# Patient Record
Sex: Female | Born: 1940 | Race: White | Hispanic: No | Marital: Single | Smoking: Current every day smoker
Health system: Southern US, Community
[De-identification: ages and names within clinical notes are randomized; demographics above are authoritative.]

## PROBLEM LIST (undated history)

## (undated) DIAGNOSIS — C50919 Malignant neoplasm of unspecified site of unspecified female breast: Secondary | ICD-10-CM

## (undated) DIAGNOSIS — I10 Essential (primary) hypertension: Secondary | ICD-10-CM

## (undated) DIAGNOSIS — E7800 Pure hypercholesterolemia, unspecified: Secondary | ICD-10-CM

## (undated) DIAGNOSIS — R251 Tremor, unspecified: Secondary | ICD-10-CM

## (undated) DIAGNOSIS — C7800 Secondary malignant neoplasm of unspecified lung: Secondary | ICD-10-CM

## (undated) DIAGNOSIS — E049 Nontoxic goiter, unspecified: Secondary | ICD-10-CM

## (undated) DIAGNOSIS — K279 Peptic ulcer, site unspecified, unspecified as acute or chronic, without hemorrhage or perforation: Secondary | ICD-10-CM

## (undated) DIAGNOSIS — J449 Chronic obstructive pulmonary disease, unspecified: Secondary | ICD-10-CM

## (undated) DIAGNOSIS — K449 Diaphragmatic hernia without obstruction or gangrene: Secondary | ICD-10-CM

## (undated) DIAGNOSIS — F32A Depression, unspecified: Secondary | ICD-10-CM

## (undated) DIAGNOSIS — I2510 Atherosclerotic heart disease of native coronary artery without angina pectoris: Secondary | ICD-10-CM

## (undated) DIAGNOSIS — F329 Major depressive disorder, single episode, unspecified: Secondary | ICD-10-CM

## (undated) DIAGNOSIS — G243 Spasmodic torticollis: Secondary | ICD-10-CM

## (undated) DIAGNOSIS — K219 Gastro-esophageal reflux disease without esophagitis: Secondary | ICD-10-CM

## (undated) HISTORY — DX: Major depressive disorder, single episode, unspecified: F32.9

## (undated) HISTORY — DX: Atherosclerotic heart disease of native coronary artery without angina pectoris: I25.10

## (undated) HISTORY — DX: Chronic obstructive pulmonary disease, unspecified: J44.9

## (undated) HISTORY — DX: Malignant neoplasm of unspecified site of unspecified female breast: C50.919

## (undated) HISTORY — DX: Gastro-esophageal reflux disease without esophagitis: K21.9

## (undated) HISTORY — DX: Pure hypercholesterolemia, unspecified: E78.00

## (undated) HISTORY — DX: Depression, unspecified: F32.A

## (undated) HISTORY — PX: TONSILLECTOMY: SUR1361

## (undated) HISTORY — DX: Spasmodic torticollis: G24.3

## (undated) HISTORY — DX: Peptic ulcer, site unspecified, unspecified as acute or chronic, without hemorrhage or perforation: K27.9

## (undated) HISTORY — DX: Nontoxic goiter, unspecified: E04.9

## (undated) HISTORY — DX: Tremor, unspecified: R25.1

## (undated) HISTORY — DX: Diaphragmatic hernia without obstruction or gangrene: K44.9

## (undated) HISTORY — PX: APPENDECTOMY: SHX54

## (undated) HISTORY — DX: Essential (primary) hypertension: I10

## (undated) HISTORY — DX: Secondary malignant neoplasm of unspecified lung: C78.00

---

## 1990-08-26 DIAGNOSIS — C7800 Secondary malignant neoplasm of unspecified lung: Secondary | ICD-10-CM

## 1990-08-26 HISTORY — DX: Secondary malignant neoplasm of unspecified lung: C78.00

## 1994-08-26 HISTORY — PX: MASTECTOMY: SHX3

## 1995-08-27 HISTORY — PX: LUNG REMOVAL, PARTIAL: SHX233

## 2001-07-26 HISTORY — PX: ANGIOPLASTY: SHX39

## 2001-08-19 ENCOUNTER — Encounter: Payer: Self-pay | Admitting: Cardiology

## 2001-08-19 ENCOUNTER — Inpatient Hospital Stay (HOSPITAL_COMMUNITY): Admission: EM | Admit: 2001-08-19 | Discharge: 2001-08-21 | Payer: Self-pay | Admitting: Emergency Medicine

## 2001-08-26 HISTORY — PX: ROTATOR CUFF REPAIR: SHX139

## 2003-06-17 ENCOUNTER — Inpatient Hospital Stay (HOSPITAL_COMMUNITY): Admission: EM | Admit: 2003-06-17 | Discharge: 2003-06-20 | Payer: Self-pay | Admitting: Emergency Medicine

## 2003-06-17 ENCOUNTER — Encounter: Payer: Self-pay | Admitting: Emergency Medicine

## 2005-11-08 ENCOUNTER — Encounter: Admission: RE | Admit: 2005-11-08 | Discharge: 2005-11-08 | Payer: Self-pay | Admitting: Family Medicine

## 2005-11-15 ENCOUNTER — Ambulatory Visit: Payer: Self-pay | Admitting: Cardiology

## 2005-12-02 ENCOUNTER — Ambulatory Visit: Payer: Self-pay

## 2007-05-08 ENCOUNTER — Encounter: Admission: RE | Admit: 2007-05-08 | Discharge: 2007-05-08 | Payer: Self-pay | Admitting: Family Medicine

## 2007-10-22 ENCOUNTER — Ambulatory Visit: Payer: Self-pay | Admitting: Cardiology

## 2008-06-29 ENCOUNTER — Other Ambulatory Visit: Admission: RE | Admit: 2008-06-29 | Discharge: 2008-06-29 | Payer: Self-pay | Admitting: Family Medicine

## 2008-07-26 ENCOUNTER — Encounter: Admission: RE | Admit: 2008-07-26 | Discharge: 2008-07-26 | Payer: Self-pay | Admitting: Family Medicine

## 2008-08-15 ENCOUNTER — Encounter: Admission: RE | Admit: 2008-08-15 | Discharge: 2008-08-15 | Payer: Self-pay | Admitting: Family Medicine

## 2009-08-22 ENCOUNTER — Encounter: Admission: RE | Admit: 2009-08-22 | Discharge: 2009-08-22 | Payer: Self-pay | Admitting: Family Medicine

## 2010-08-23 ENCOUNTER — Encounter
Admission: RE | Admit: 2010-08-23 | Discharge: 2010-08-23 | Payer: Self-pay | Source: Home / Self Care | Attending: Family Medicine | Admitting: Family Medicine

## 2011-01-08 NOTE — Assessment & Plan Note (Signed)
[facility]                            CARDIOLOGY OFFICE NOTE   Courtney, Larson                         MRN:          [id number]  DATE:10/22/2007                            DOB:          [birth year]    PRIMARY:  Dr. [name].   CONSULTING PHYSICIAN:  Dr. [name].   THE REASON FOR CONSULTATION:  Evaluate patient preoperatively.  She has  coronary disease.   HISTORY OF PRESENT ILLNESS:  The patient is now 70 years old.  It has  been about 2 years since her last visit.  She was referred back by the  anesthesiologist.  She is due to have a left rotator cuff surgery.  Since I last saw her, she has had no new cardiovascular complaints.  She  will occasionally take nitroglycerin.  Says she took the last about 6  months ago.  She does this for chest pain.  Is somewhat reminiscent of  her previous angina but unchanged from what she described in 2007.  At  that time in 2007, she did have a stress perfusion study which  demonstrated no evidence of ischemia or infarct.  She had a well-  preserved ejection fraction 71%.   She is functional.  She climbs stairs and can do vacuuming (greater than  5 METS).  She cannot bring on any symptoms with this.  She does have  some chronic dyspnea with exertion but this is not any different.  She  has no resting shortness of breath.  Denies any PND or orthopnea.  Cut  back her cigarettes but still smokes 1/4 pack a day.  She plans to quit  smoking altogether in the next few days.   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  She had a subsequent  catheterization in 2004 for chest discomfort and had 30% in-stent  restenosis and irregularities in the circumflex but was otherwise free  of high-grade  disease).   PAST SURGICAL HISTORY:  Mastectomy as described, partial lobectomy,  appendectomy, tonsillectomy.   ALLERGIES:  ARTANE.   MEDICATIONS:  Aspirin 81 mg daily, Lipitor (the patient does not know  the dose), Protonix 40 mg daily.   SOCIAL HISTORY:  His the patient retired.  She likes cross-stitching.  She is divorced.  She has 2 daughters.  She was living in [place] and  lives here now.  She has been smoking for about 35 years.   FAMILY HISTORY:  Contributory for early coronary artery disease.  She  had a brother with a infarct in his 7s and transplant 52.   REVIEW OF SYSTEMS:  As stated in the HPI otherwise negative for other  systems.   PHYSICAL EXAMINATION:  The patient is in no acute distress.  Blood pressure 160/97, heart rate 95 and regular.  HEENT:  Eyelids unremarkable.  Pupils are equal, round, and reactive to  light and accommodation.  Fundi are not visualized.  Oral mucosa  unremarkable.  NECK:  No jugular venous distension 45 degrees, carotid upstroke brisk  and symmetric, no bruits, thyromegaly.  LYMPHATICS:  No cervical, axillary, or inguinal adenopathy.  LUNGS:  Clear to auscultation bilaterally.  BACK:  No costovertebral angle tenderness.  CHEST:  Unremarkable.  HEART:  PMI not displaced or sustained, S1 and S2 within normal limits,  no S3, no S4, no clicks, rubs, murmurs.  ABDOMEN:  Flat, positive bowel sounds, normal in frequency and pitch, no  bruits, rebound, guarding.  No midline pulsatile mass, hepatomegaly,  splenomegaly.  SKIN:  No rashes, no nodules.  EXTREMITIES:  With 2+ pulses throughout, no edema, cyanosis, clubbing.  NEURO:  Oriented to person, place, and time, cranial nerves 2-12 grossly  intact, motor grossly intact.    EKG sinus rhythm, rate 92, axis within normal limits, intervals within  normal limits, no acute ST-T wave changes.   ASSESSMENT/PLAN:  1. Preoperative clearance.  The patient has no high-risk features.      She  had no symptoms.  She has a reasonable functional level.  She      had a negative stress perfusion study 2 years ago.  This point no      further cardiovascular testing is suggested in she would be at      acceptable risk for planned procedure based on ACC/AHA guidelines.  2. Dyslipidemia per Dr. [name].  Goal will be an LDL less than 100,      HDL greater than 50.  3. Tobacco.  She plans on quitting cold [place].  We discussed this in      the past and she was given a prescription for Chantix.  She thinks      she is can stop all together, especially with the cost of      cigarettes going up.  4. Hypertension.  Blood pressure is slightly elevated.  However, she      reports takes at home is typically in 130s systolic and not above      90 diastolic. I asked her to keep blood pressure diary.  She has      actually done this for Dr. [name].  Certainly she would need      management perhaps with beta blocker if this does not exacerbate.      Will use ACE inhibitor given her      coronary disease.  I will defer to Dr. [name].  5. Follow-up will be as needed.     [name], MD, [facility]  Electronically Signed    JH/MedQ  DD: 10/22/2007  DT: 10/23/2007  Job #: [id number]   cc:   [name], M.D.  Erasmo Leventhal, M.D.

## 2011-01-11 NOTE — Discharge Summary (Signed)
[place]  Patient:    Courtney Larson, Courtney Larson Visit Number: [id number] MRN: [id number]          Service Type: MED Location: CCUB 2910 01 Attending Physician:  [name] Dictated by:   Joellyn Rued, P.A.-C. Admit Date:  08/19/2001                    Referring Physician Discharge [place]  NO DICTATION. Dictated by:   Joellyn Rued, P.A.-C. Attending Physician:  [name] DD:  08/21/01 TD:  08/21/01 Job: [id number]

## 2011-01-11 NOTE — Discharge Summary (Signed)
   NAMECHENNEL, OLIVOS                            ACCOUNT NO.:  [id number]   MEDICAL RECORD NO.:  [id number]                   PATIENT TYPE:  INP   LOCATION:  3727                                 FACILITY:  [facility]   PHYSICIAN:  [name], M.D.                DATE OF BIRTH:  07/02/1941   DATE OF ADMISSION:  06/17/2003  DATE OF DISCHARGE:                           DISCHARGE SUMMARY - REFERRING   ADDENDUM:  To dictation [phone].  She lives in [place], and I am having  trouble getting hold of her cardiologist.  It was Dr. Ellamae Sia.  I had given  his phone number, but I have tried it three times, it is always busy.  Here  is what I suggest we do with this transcription.  Ms. Steelman is going to be  staying with her daughter for the next couple of weeks, and so we night be  able to get the transcript sent to her daughter's house, and I am suggesting  we do that.  Her daughter is named Eugene Garnet, [address], [place],  [place].  I was thinking we could send a copy to her, and she could  just carry it over to her cardiologist, Dr. Ellamae Sia in [place].  Let's give  that a try.      Maple Mirza, P.A.                    [name], M.D.    GM/MEDQ  D:  06/20/2003  T:  06/20/2003  Job:  [id number]

## 2011-01-11 NOTE — H&P (Signed)
[place]  Patient:    Courtney Larson, Courtney Larson Visit Number: [id number] MRN: [id number]          Service Type: MED Location: CCUB 2910 01 Attending Physician:  [name] Dictated by:   [name], M.D. [facility] Admit Date:  08/19/2001   CC:         Dr. Krista Blue in [place]   History and Physical  PRIMARY PHYSICIAN:  Dr. Krista Blue in [place].  REASON FOR PRESENTATION:  Evaluate patient with chest pain.  HISTORY OF PRESENT ILLNESS:  The patient is a 70 year old without prior cardiac history.  She reports a history of chest discomfort on and off for one year.  This is not exertional, but it occurs at rest.  Today, she had a severe episode of this.  She called me at home.  Her daughter is my neighbor.  At the time we talked on the phone, the pain was substernal.  It had started in her jaw.  It was a pressure-type discomfort.  She did have radiation straight through to her back.  She was so uncomfortable, she was unable to complete sentences on the phone.  She was nauseated, but did not vomit.  She had no shortness of breath.  She does not described PND or orthopnea.  She had no palpitations.  She had no radiation down her arms.  She was similar to previous episodes.  The pain lasted approximately 35 minutes and was almost completely gone by the EMS squad arrived.  At that time, there were no acute EKG changes.  She is currently pain-free.  PAST MEDICAL HISTORY:  The patient has no history of hypertension or diabetes. She does have a history of peptic ulcer disease treated.  She has hypertriglyceridemia (she remembers it being 420).  Spastic torticollis, right breast cancer with mastectomy five years ago, metastasis to the lungs with partial resection four years ago, and treatment with chemotherapy.  PAST SURGICAL HISTORY:  (See above), right rotator cuff surgery in May, appendectomy, tonsillectomy.  ALLERGIES:   ARTANE.  MEDICATIONS:  Klonopin 5 mg three times per day, Flexeril 10 mg q.d.  SOCIAL HISTORY:  The patient is divorced.  She has three children.  She lives in [place].  She smoked about one-pack-per-day for 40 years.  FAMILY HISTORY:  Is contributory for a brother having massive myocardial infarction and subsequent cardiac transplantation at age 10.  REVIEW OF SYSTEMS:  As stated in the HPI, otherwise negative for all other systems.  PHYSICAL EXAMINATION:  GENERAL:  The patient is in no distress.  VITAL SIGNS:  Blood pressure 158/98, heart rate 76 and regular.  HEENT:  Eyelids unremarkable.  Pupils are equal, round and reactive to light. Fundi not visualized.  Oral mucosa unremarkable.  NECK:  No jugular venous distention.  Waveform within normal limits.  Carotid upstroke brisk and symmetric, no bruits.  No thyromegaly.  LYMPHATICS:  No cervical, axillary, or inguinal adenopathy.  LUNGS:  Clear to auscultation bilaterally.  BACK:  No costovertebral angle tenderness.  CHEST:  Unremarkable except for a right mastectomy.  HEART:  PMI non-displaced or sustained.  S1 and S2 within normal limits.  No S3 and no S4.  No murmurs.  ABDOMEN:  Flat, positive bowel sounds.  Normal in frequency and pitch.  No bruits, rebound, guarding, midline pulsatile mass.  No hepatomegaly or splenomegaly.  SKIN:  No rashes, and no nodules.  EXTREMITIES:  Two plus pulses throughout, no edema.  NEUROLOGIC:  Oriented to  person, place, and time.  Cranial nerves 2-12 grossly intact.  Motor grossly intact.  LABORATORY DATA:  EKG sinus rhythm, axis within normal limits, intervals within normal limits, no acute ST-T wave changes.  Labs pending.  Chest x-ray pending.  ASSESSMENT AND PLAN: 1. Chest discomfort.  The patients chest discomfort had some features typical    for unstable angina and an acute coronary syndrome.  She certainly has risk    factors, particularly her smoking.  However, it  is somewhat unusual.  These    same episodes have been occurring for over a year.  We have discussed this    at great length.  The patient needs to be referred for cardiac    catheterization with this being assumed to be unstable angina.  Further    evaluation based on these results. 2. Hypertriglyceridemia.  We will get a fasting lipid profile. 3. Tobacco.  We discussed stop smoking and she is committed to quitting. 4. Hypertension.  She is slightly hypertensive here, but otherwise has no    history of this.  We will follow this and treat as needed. Dictated by:   [name], M.D. [facility] Attending Physician:  [name] DD:  08/19/01 TD:  08/20/01 Job: [id number]

## 2011-01-11 NOTE — Discharge Summary (Signed)
Courtney Larson, Courtney Larson                            ACCOUNT NO.:  [id number]   MEDICAL RECORD NO.:  [id number]                   PATIENT TYPE:  INP   LOCATION:  3727                                 FACILITY:  [facility]   PHYSICIAN:  [name], M.D.                DATE OF BIRTH:  21-Feb-1941   DATE OF ADMISSION:  06/17/2003  DATE OF DISCHARGE:  06/20/2003                           DISCHARGE SUMMARY - REFERRING   DISCHARGE DIAGNOSES:  1. Chest discomfort and heaviness in crescendo pattern on admission.  2. History of coronary artery disease with negative Cardiolite August 2004.  3. A 30% in-stent restenosis at the proximal left anterior descending on     left heart catheterization June 20, 2003.  4. 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  5. Normal left ventricular function.   SECONDARY DIAGNOSES:  1. Peptic ulcer disease.  2. Hypertriglyceridemia.  3. Spastic torticollis.  4. History of right breast cancer status post mastectomy.  5. Metastasis to the lungs with partial resection six years ago and     chemotherapy.  6. Status post right rotator cuff surgery.  7. Partial resection of the left lower lobe.  8. Status post appendectomy.  9. Status post tonsillectomy.   PROCEDURE:  June 20, 2003, left heart catheterization by Dr. Charlies Constable.  The study showed that there is a 30% in-stent restenosis in the  proximal LAD, otherwise coronaries are without significant disease.  Left  ventricular function is normal.  There was a 50% left renal artery stenosis.  The patient tolerated the procedure well.  The catheter insertion was closed  with angioseal.  There has been no swelling, drainage, erythema or pain at  the catheterization site in the post procedure period.   DISPOSITION:  Courtney Larson is ready for discharge June 20, 2003, after  undergoing left heart catheterization by way of the right femoral artery.  No obstructive coronary artery disease was observed, although there was  a  30% in-stent restenosis of the proximal LAD.  The patient is chest pain free  at the time.  She will go home on Nexium 40 mg every 12 hours.  She is to  follow up with [facility] office Monday, July 04, 2003, at 2  o'clock in the afternoon for a catheterization site check in the right  groin.   DISCHARGE MEDICATIONS:  1. Enteric coated aspirin 81 mg daily.  2. Nexium 40 mg twice daily.  3. Zocor 40 mg daily at bedtime.  4. Klonopin 0.5 mg three times daily as needed.  5. Nitroglycerin 0.4 mg one tablet under the tongue every five minutes x3 as     needed for chest pain.  6. Toprol XL 25 mg daily.  This was started on admission.  Relaxes the heart     with blood pressure control.  7. For pain management she may use Tylenol 325 mg one to two  tablets every     four to six hours as needed for pain.   ACTIVITY:  She is not to engage in heavy lifting, driving or sexual  intercourse the next two days.   DISCHARGE DIET:  Low sodium, low cholesterol diet.   DISCHARGE INSTRUCTIONS:  She may shower.  She is to call [facility]  at [phone] if she experiences swelling or increased pain at the  catheterization site.   FOLLOW UP:  Once again, she has follow-up at [facility], [place],  [place], office Monday, July 04, 2003, at 2 o'clock in the  afternoon.   HISTORY OF PRESENT ILLNESS:  Courtney Larson is a 70 year old female with a  history of chest pain and cardiac catheterization with stenting of the LAD  in December of 2002.  She lives in [place], [place], but is visiting her  daughter in [place].  She says she has had chest  discomfort on and off for several months.  It is sporadic.  She did have a  stress test in August.  It was negative for ischemia.  She reports she had  about three episodes in the last three months, however, for the last five  days she has had more frequent chest discomfort.  She describes it as a   heaviness.  It seems to occur when she starts doing something.  It does ease  with nitroglycerin, usually she takes three for alleviation of the pain  either similar to her previous angina, although today she really has not had  any severe pain as she has had.  Today, June 17, 2003, she had more chest  pressure and describes a pain that has been 6/10 in intensity.  There is  no associated nausea, vomiting, or diaphoresis.  She has not had any  palpitations, presyncope, or syncope.  She denies any history of PND or  orthopnea.  She has chronic dyspnea, mild with exertion, and seems to be at  baseline.  Family thinks that she has been a bit more fatigued recently.  She rarely has had palpitations and has had no presyncope or syncope.  The  patient will be admitted.  She will be started on a beta-blocker, Lopressor  12.5 mg t.i.d., also on IV nitroglycerin as she will be maintained on a  heparin drip per pharmacy protocol.   HOSPITAL COURSE:  After admission to [facility] on  June 17, 2003, with a crescendo type chest pain similar to angina  experience in December 2002, when she had catheterization and stenting of  the LAD.  The patient has done well.  Blood pressure was controlled with  Toprol. She was treated with Protonix and also maintained on IV heparin.  She was chest pain free over the weekend of June 18, 2003, and June 19, 2003, and underwent left heart catheterization today, June 20, 2003,  with the findings as dictated above.  The patient is chest pain free at the  time of discharge.  Her catheterization site in the right groin shows no  evidence of erythema, swelling, drainage.  She has had very little pain at  that site.  She goes home with the medications and follow-up as listed  above.   LABORATORY DATA:  The following labs had been taken this hospitalization on June 20, 2003.  Serum electrolytes:  Sodium 139, potassium 3.7, chloride   107, carbonate 25, glucose 101, BUN 9 and creatinine 0.8.  A complete  blood  count on June 20, 2003, is white cells 6.3, hemoglobin 13.4, hematocrit  39.6, platelets 185.  Thyroid stimulating hormone 1.491.  Fasting lipid  profile shows her cholesterol was 95, triglycerides 324, HDL cholesterol was  21, LDL cholesterol is 9.  Cardiac markers this admission in the emergency  room on June 17, 2003, at 1631 hours, myoglobin is 72, CK-MB 2.5 and  troponin less than 0.05.  Other enzymes June 17, 2003, at  2250 hours with CK 78, CK-MB 2.2, troponin I is less than 0.01.  Cardiac  enzymes June 18, 2003, at 0410 hours with CK 85, CK-MB 2.7, troponin I  less than 0.01.  Cardiac enzymes on June 18, 2003, 1440 hours, with  CK  84, CK-MB 2.4, and troponin I is less than 0.01.      Maple Mirza, P.A.                    [name], M.D.    GM/MEDQ  D:  06/20/2003  T:  06/20/2003  Job:  [id number]   cc:   Alpha Gula, M.D.  [place], [place]   Dr. Rolm Bookbinder, [place]  phone:  [phone]

## 2011-01-11 NOTE — Cardiovascular Report (Signed)
Courtney Larson, NACHREINER                            ACCOUNT NO.:  [id number]   MEDICAL RECORD NO.:  [id number]                   PATIENT TYPE:  INP   LOCATION:  3727                                 FACILITY:  [facility]   PHYSICIAN:  Charlies Constable, M.D.                  DATE OF BIRTH:  22-May-1941   DATE OF PROCEDURE:  06/20/2003  DATE OF DISCHARGE:                              CARDIAC CATHETERIZATION   CLINICAL HISTORY:  Courtney Larson is 70 years old and is the mother of Courtney Larson, M.D. neighbor.  She was visiting here two years ago and had  unstable angina, had a stent placed in the proximal LAD by Carole Binning, M.D. [facility].  This was a BX Velocity stent.  She has been having  symptoms of chest tightness for about two months which have become worse in  the last four days.  She was again visiting her daughter here and Courtney Larson, M.D. was consulted and arranged for her admission with a diagnosis  of possible unstable angina.   PROCEDURE:  The procedure was performed via the right femoral artery using  arterial sheath and 6-French preformed coronary catheters.  A front wall  arterial puncture was performed and Omnipaque contrast was used.  Distal  aortogram was performed to rule out abdominal aortic aneurysm.  The right  femoral artery was closed with AngioSeal at the end of the procedure.  The  patient tolerated the procedure well and left the laboratory in satisfactory  condition.   RESULTS:  Aortic pressure 146/78 with a mean of 106.  Left ventricular  pressure was 146/17.   Left main coronary artery:  Free of significant disease.   Left anterior descending artery:  Gave rise to two diagonal branches and two  septal perforators.  There was 30% focal stenosis within the proximal aspect  of the stent in the proximal LAD.  There were irregularities throughout the  LAD, but no other significant obstruction.   Circumflex artery:  Gave rise to an atrial branch and two marginal  branches  and two posterolateral branches.  These vessels were free of significant  disease, although there were some irregularities.   Right coronary artery:  Small to moderate sized vessel.  Gave rise to a  conus branch, right ventricular branch, posterior descending artery, and a  very small posterolateral branch.  These vessels were free of significant  disease.   LEFT VENTRICULOGRAM:  The left ventriculogram performed in the RAO  projection showed good wall motion with no areas of hypokinesis.   DISTAL AORTOGRAM:  A distal aortogram was performed which showed 50%  narrowing in the left renal artery.  There were irregularities in the distal  aorta, but no major obstruction.   CONCLUSIONS:  Coronary artery disease status post stenting of the proximal  left anterior descending in 2002 with focal 30% narrowing in the  proximal  aspect of the stent and irregularities in the mid left anterior descending,  irregularities in the circumflex artery, no significant obstruction in the  right coronary artery, and normal left ventricular function.    RECOMMENDATIONS:  There does not appear to be any source of ischemia.  In  view of these findings I will discuss with Courtney Larson, M.D. regarding  further evaluation for other causes of her symptoms.                                               Charlies Constable, M.D.    BB/MEDQ  D:  06/20/2003  T:  06/20/2003  Job:  [id number]   cc:   Phillips Odor, DT  [place], [place]   CP Lab

## 2011-01-11 NOTE — Discharge Summary (Signed)
[place]. [facility]  Patient:    Courtney Larson, Courtney Larson Visit Number: [id number] MRN: [id number]          Service Type: MED Location: CCUB 2910 01 Attending Physician:  [name] Dictated by:   Joellyn Rued, P.A.-C. Admit Date:  08/19/2001 Disc. Date: 08/21/01   CC:         [name], M.D. [facility]   Discharge Summary  DATE OF BIRTH:  1940/12/20  ADMITTING PHYSICIAN:  Dr. Antoine Poche.  DISCHARGING PHYSICIAN:  Dr. Chales Abrahams.  SUMMARY OF HISTORY:  Courtney Larson is a 70 year old white female without prior cardiac history.  She contacted her daughters neighbor, who happens to be a physician in our practice.  She described a history of chest discomfort on and off for one year, occurring at rest.  On Christmas, she had a severe episode and called Dr. Antoine Poche at home.  The discomfort actually started in her jaw and went into her chest and described it as a pressure-type discomfort, radiating through to her back, associated with nausea.  She denied any shortness of breath or vomiting.  She felt it was similar to prior episodes, but this was more severe.  The discomfort lasted approximately 35 minutes and by the time EMS arrived, it had alleviated.  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  PAST SURGICAL HISTORY:  Notable for right rotator cuff tear, appendectomy, tonsillectomy.  MEDICATIONS:  Prior to admission, she was taking Klonopin 5 mg t.i.d. and Flexeril 10 q.d.  HABITS:  She smokes at least a pack per day and has been doing so for 40 years.  FAMILY HISTORY:  Notable for early coronary disease.  LABORATORY DATA:  Serial of three CK isoenzymes and troponins were negative for myocardial infarction.  Admission sodium was 141,  potassium 3.2, BUN 13, creatinine .9.  AST and ALT were slightly elevated at 49 and 50.  ALP was slightly elevated at 141.  Amylase and lipase were 83 and 38.  Fasting lipids showed a total cholesterol of 196, triglycerides 728, HDL 26, LDL was not calculated due to the high triglycerides.  H&H was 16.4 and 46.8.  Normal indices.  Platelets 187, WBC 6.1, PT 14.9, PTT 38, D-dimer .26.  Chest x-ray showed suboptimal inspiration, no active disease, probable lung resection at the left base, right axillary node dissection.  Her EKG showed normal sinus rhythm, nonspecific ST-T wave changes.  HOSPITAL COURSE:  Courtney Larson was admitted to [facility] to 2900. Enzymes and EKGs were negative for myocardial infarction.  She did not have any further chest discomfort.  She was placed on Lovenox, beta blocker, aspirin, as well as her home Klonopin.  On 08/20/01, Dr. Gerri Spore performed cardiac catheterization.  According to his progress notes, her EF was 61%, no MR, LV pressure 136/6, aorta 140/84.  She had normal coronaries except for a 20% proximal LAD followed by a 90% proximal LAD.  Angioplasty stenting was performed to the proximal LAD, reducing this from 90% to 0%, restoring TIMI grade 3 flow.  She was continued on Integrilin for 18 hours and recommended Plavix for at least 4 weeks.  Lopid was started in regards to her hypertriglyceridemia.  At the time of discharge, 08/21/01, H&H was 14.7 and 41.1, platelets 207, WBC 8.0.  Sodium 138, potassium 4.6,  BUN 7, creatinine .9, glucose 104.  After Dr. Chales Abrahams reviewed the chart, it was felt that she could be discharged home.  DISCHARGE DIAGNOSES: 1. Unstable angina, status post angioplasty stenting to the left anterior    descending as previously described. 2. Obesity. 3. Tobacco use. 4. Hypertriglyceridemia associated with a low HDL. 5. Hypertension. 6. History as previously.  DISPOSITION:  She is discharged home to her daughters home here  in [place].  She states that she will remain in [place] for a couple of weeks.  She will see Dr. Antoine Poche in the office on January 7 at 11:45 a.m. At some point, she will return to [place].  She was advised to follow up with her internist, Dr. Krista Blue, in [place], [place], and he will refer her for a cardiologist for further evaluation.  She was also recommended to consider a cardiac rehabilitation exercise program, diet especially low in salt and carbohydrates.  She will need fasting lipids and LFTs in approximately six weeks.  She was also advised to discontinue smoking or any tobacco products used.  Her activities were restricted to two days in regards to lifting, driving, sexual activity, or heavy exertion.  If she had any problems with her catheterization site, she was asked to call us.  DISCHARGE MEDICATIONS: 1. Plavix 75 mg q.d. duration to be determined by physician. 2. Lopressor 50 mg 1/2 tablet b.i.d. 3. Coated aspirin 325 mg q.d. 4. Sublingual nitroglycerin as needed. 5. Foltx 1 q.d. 6. Lopid 600 mg b.i.d.  The patient is taking a copy of her discharge summary with her. Dictated by:   Joellyn Rued, P.A.-C. Attending Physician:  [name] DD:  08/21/01 TD:  08/21/01 Job: [id number]

## 2011-01-11 NOTE — H&P (Signed)
NAMETASHAWNDA, BLEILER NO.:  [id number]   MEDICAL RECORD NO.:  [id number]                   PATIENT TYPE:  EMS   LOCATION:  MINO                                 FACILITY:  [facility]   PHYSICIAN:  [name], M.D.                DATE OF BIRTH:  13-Oct-1940   DATE OF ADMISSION:  06/17/2003  DATE OF DISCHARGE:                                HISTORY & PHYSICAL   PRIMARY CARE PHYSICIAN:  Dr. Krista Blue, [place].   REASON FOR ADMISSION:  Evaluate patient with chest pain.   HISTORY OF PRESENT ILLNESS:  The patient is a pleasant 70 year old with a  history of chest pain and cardiac catheterization with stenting of an LAD in  December, 2002.  She lives in [place] but is visiting her daughter in  town.  She says that she has had chest discomfort on and off for several  months.  It was sporadic.  She did have a stress test in August which  apparently was negative for any evidence of ischemia.  She reports she has  had about three episodes in the last three months.  However, for the last  five days, she has had more frequent chest discomfort.  She describes a  heaviness.  It seems to occur when she starts doing something.  It does ease  with nitroglycerin, usually taking three to be alleviated.  It is similar to  her previous angina, although today she really has not had any severe pain  such as she had.  Today she did have more than chest pressure and describes  a pain.  It has been 6/10 in intensity.  There has been no associated  nausea, vomiting or diaphoresis.  She has not had any palpitations,  presyncope or syncope.  She denies PND or orthopnea.  She has chronic  dyspnea, mildly with exertion.  It seems to be at baseline.  Her family  thinks that she has been a little bit more fatigued recently.  She rarely  has palpitations, but has no presyncope or syncope.   PAST MEDICAL HISTORY:  1. Peptic ulcer disease.  2. Hypertriglyceridemia.  3.  Spastic torticollis.  4. Right breast cancer with mastectomy.  5. Metastasis to the lungs with partial resection six years ago and     treatment with chemotherapy.   PAST SURGICAL HISTORY:  1. Right rotator cuff surgery.  2. Mastectomy.  3. Partial resection of the left lower lobe.  4. Appendectomy.  5. Tonsillectomy.   ALLERGIES:  Artane.   MEDICATIONS:  1. Klonopin 5 mg three times a day.  2. Prilosec 20 mg daily.  3. Zocor 40 mg daily.  4. Aspirin 81 mg daily.  5. Nitroglycerin patch.   SOCIAL HISTORY:  The patient is divorced.  She has three children. She lives  in [place].  She used to smoke one pack a day for 40  years.  She quit a  couple of years ago.   FAMILY HISTORY:  Contributory for a brother having a myocardial infarction  in his 17's and subsequent transplantation at age 27.   REVIEW OF SYSTEMS:  As stated in HPI.  Positive for longstanding chronic leg  pain, otherwise negative for other systems.   PHYSICAL EXAMINATION:  GENERAL:  The patient is in no distress.  VITAL SIGNS:  Blood pressure 133/72, heart rate 89 and regular.  HEENT:  Eyes unremarkable, pupils equal, round and reactive to light.  Fundi  not visualized.  Oral mucosa is unremarkable.  NECK:  No jugular venous distention. Waveform within normal limits.  Carotid  upstroke brisk and symmetrical.  No bruits or thyromegaly.  LYMPHATICS:  No cervical, axillary or inguinal adenopathy.  LUNGS:  Clear to auscultation bilaterally.  BACK:  No costal vertebral angle tenderness.  HEART:  PMI not displaced or sustained.  S1, S2 within normal limits.  No  S3, no S4, no murmurs.  CHEST:  Well-healed surgical scar.  ABDOMEN:  Positive bowel sounds, normal in frequency, pitch, bruise, no  rebound, no guarding, no midline pulsatile mass, no organomegaly.  SKIN:  No rash.  EXTREMITIES:  There are 2+ pulses throughout.  No edema, no cyanosis or  clubbing.  NEUROLOGIC:  Oriented to person, place and time.   Cranial nerves II-XII are  grossly intact.  Motor is grossly intact throughout.   LABORATORY DATA:  EKG:  Sinus rhythm, axis within normal limits.  Intervals within normal  limits.  Early transition lead V2.  No acute ST-T wave changes.   Labs are pending.   Chest x-ray:  Left lower surgical clips, poor inspiratory effort, no edema.   ASSESSMENT/PLAN:  1. The patient's chest discomfort is worrisome for unstable angina.  It has     been progressive.  She will be admitted with heparin and IV     nitroglycerin.  We will discontinue the topical nitroglycerin.  She will     continue aspirin.  She will have cardiac catheterization electively or     more urgently if needed.  2. Risk reductions.  She reports her lipids are excellent.  We will get a     lipid profile.  She will continue on he Zocor for now.  3. Breast cancer.  This has been cured.  4. Spastic torticollis.                                               [name], M.D.   JH/MEDQ  D:  06/17/2003  T:  06/18/2003  Job:  [id number]

## 2011-01-11 NOTE — Cardiovascular Report (Signed)
[place]. [facility]  Patient:    Courtney Larson, Courtney Larson Visit Number: [id number] MRN: [id number]          Service Type: MED Location: [facility]UB 2910 01 Attending Physician:  [name] Dictated by:   Daisey Must, M.D. [facility] Proc. Date: 08/20/01 Admit Date:  08/19/2001   CC:         [name], M.D. [facility]  Blanche East, M.D., [facility], [place]  Cardiac Catheterization Lab   Cardiac Catheterization  PROCEDURE: 1. Left heart catheterization with coronary angiography and left    ventriculography. 2. Percutaneous transluminal coronary angioplasty with stent placement in    the proximal left anterior descending artery.  CARDIOLOGIST:  Daisey Must, M.D. [facility]  INDICATION:  Ms. Doty is a 70 year old woman admitted yesterday with unstable angina.  CATHETERIZATION PROCEDURAL NOTE:  A 6-French sheath was placed in the right femoral artery.  Coronary angiography was performed with standard Judkins 6-French catheters.  Left ventriculography was performed with an angled pigtail catheter.  Contrast was Omnipaque.  There were no complications.  CATHETERIZATION RESULTS:  HEMODYNAMICS:  Left ventricular pressure 136/16, aortic pressure 140/84. There was no aortic valve gradient.  LEFT VENTRICULOGRAM:  Wall motion is normal.  Ejection fraction calculated at 61%.  There is trace mitral regurgitation.  CORONARY ANGIOGRAPHY: (Codominant).  Left main is normal.  Left anterior descending artery has a 20% stenosis just after its origin. Following this in the proximal vessel is a tubular 90% stenosis.  The LAD gives rise to a small first diagonal and a normal size second diagonal.  The LAD is relatively large and curls the apex.  The left circumflex is a large codominant vessel.  It gives rise to three small obtuse marginal branches, a normal size first posterolateral branch and a small second posterolateral branch.  The left circumflex is  angiographically normal.  The right coronary artery is a relatively small codominant vessel.  It ends as a normal size posterior descending artery.  The right coronary artery is angiographically normal.  IMPRESSIONS: 1. Normal left ventricular systolic function. 2. One-vessel coronary artery disease.  The culprit lesion is the 90%    stenosis in the proximal left anterior descending artery.  PLAN:  Percutaneous intervention of the left anterior descending artery.  See below.  PTCA PROCEDURE NOTE:  Following completion of the diagnostic catheterization, we proceeded with percutaneous coronary intervention.  The 6-French sheath in the right femoral artery was exchanged over wire for a 7-French sheath. Heparin and Integrilin were administered per protocol.  We used a 7-French JL4 guiding catheter and a BMW wire.  The lesion was initially predilated with a 3.0 x 15 mm Quantum balloon inflated to 8 atmospheres.  We then deployed a 3.0 x 18 m  Bx Velocity stent at a deployment pressure of 18 atmospheres.  The stent was post dilated with a 3.5 x 13 mm PowerSail balloon.  This balloon was inflated to 10 and then 16 atmospheres at the distal aspect of the stent and 18 followed by 22 atmospheres in the proximal aspect of the stent.  Final angiographic images revealed patency of the LAD with 0% residual stenosis and TIMI-3 flow.  COMPLICATIONS:  None.  RESULTS:  Successful PTCA with stent placement in the proximal left anterior descending artery reducing a 90% stenosis to 0% residual with TIMI-3 flow.  PLAN:  Integrilin will be continued for 18 hours.  Plavix will be administered for a minimum of four weeks. Dictated by:   Daisey Must, M.D.  [facility] Attending Physician:  [name] DD:  08/20/01 TD:  08/20/01 Job: [id number]

## 2011-09-19 DIAGNOSIS — J329 Chronic sinusitis, unspecified: Secondary | ICD-10-CM | POA: Diagnosis not present

## 2011-09-19 DIAGNOSIS — R059 Cough, unspecified: Secondary | ICD-10-CM | POA: Diagnosis not present

## 2011-09-19 DIAGNOSIS — J029 Acute pharyngitis, unspecified: Secondary | ICD-10-CM | POA: Diagnosis not present

## 2011-09-19 DIAGNOSIS — R05 Cough: Secondary | ICD-10-CM | POA: Diagnosis not present

## 2011-09-19 DIAGNOSIS — J209 Acute bronchitis, unspecified: Secondary | ICD-10-CM | POA: Diagnosis not present

## 2011-09-25 ENCOUNTER — Other Ambulatory Visit: Payer: Self-pay | Admitting: Family Medicine

## 2011-09-25 DIAGNOSIS — Z1231 Encounter for screening mammogram for malignant neoplasm of breast: Secondary | ICD-10-CM

## 2011-10-14 ENCOUNTER — Other Ambulatory Visit: Payer: Self-pay | Admitting: Family Medicine

## 2011-10-14 ENCOUNTER — Ambulatory Visit
Admission: RE | Admit: 2011-10-14 | Discharge: 2011-10-14 | Disposition: A | Discharge status: Home / Self Care | Payer: Medicare Other | Source: Ambulatory Visit | Attending: Family Medicine | Admitting: Family Medicine

## 2011-10-14 DIAGNOSIS — Z720 Tobacco use: Secondary | ICD-10-CM

## 2011-10-14 DIAGNOSIS — R05 Cough: Secondary | ICD-10-CM | POA: Diagnosis not present

## 2011-10-14 DIAGNOSIS — J40 Bronchitis, not specified as acute or chronic: Secondary | ICD-10-CM

## 2011-10-14 DIAGNOSIS — Z853 Personal history of malignant neoplasm of breast: Secondary | ICD-10-CM | POA: Diagnosis not present

## 2011-10-18 ENCOUNTER — Ambulatory Visit
Admission: RE | Admit: 2011-10-18 | Discharge: 2011-10-18 | Disposition: A | Discharge status: Home / Self Care | Payer: Medicare Other | Source: Ambulatory Visit | Attending: Family Medicine | Admitting: Family Medicine

## 2011-10-18 DIAGNOSIS — Z1231 Encounter for screening mammogram for malignant neoplasm of breast: Secondary | ICD-10-CM | POA: Diagnosis not present

## 2011-11-11 DIAGNOSIS — G243 Spasmodic torticollis: Secondary | ICD-10-CM | POA: Diagnosis not present

## 2011-11-21 DIAGNOSIS — K219 Gastro-esophageal reflux disease without esophagitis: Secondary | ICD-10-CM | POA: Diagnosis not present

## 2011-11-21 DIAGNOSIS — J309 Allergic rhinitis, unspecified: Secondary | ICD-10-CM | POA: Diagnosis not present

## 2011-11-21 DIAGNOSIS — R7301 Impaired fasting glucose: Secondary | ICD-10-CM | POA: Diagnosis not present

## 2011-11-21 DIAGNOSIS — Z79899 Other long term (current) drug therapy: Secondary | ICD-10-CM | POA: Diagnosis not present

## 2011-11-21 DIAGNOSIS — I2510 Atherosclerotic heart disease of native coronary artery without angina pectoris: Secondary | ICD-10-CM | POA: Diagnosis not present

## 2011-11-21 DIAGNOSIS — I10 Essential (primary) hypertension: Secondary | ICD-10-CM | POA: Diagnosis not present

## 2011-11-21 DIAGNOSIS — E782 Mixed hyperlipidemia: Secondary | ICD-10-CM | POA: Diagnosis not present

## 2011-11-21 DIAGNOSIS — J329 Chronic sinusitis, unspecified: Secondary | ICD-10-CM | POA: Diagnosis not present

## 2011-11-21 DIAGNOSIS — Z7901 Long term (current) use of anticoagulants: Secondary | ICD-10-CM | POA: Diagnosis not present

## 2011-11-21 DIAGNOSIS — E559 Vitamin D deficiency, unspecified: Secondary | ICD-10-CM | POA: Diagnosis not present

## 2012-04-13 DIAGNOSIS — G243 Spasmodic torticollis: Secondary | ICD-10-CM | POA: Diagnosis not present

## 2012-04-30 DIAGNOSIS — H9319 Tinnitus, unspecified ear: Secondary | ICD-10-CM | POA: Diagnosis not present

## 2012-04-30 DIAGNOSIS — H6120 Impacted cerumen, unspecified ear: Secondary | ICD-10-CM | POA: Diagnosis not present

## 2012-04-30 DIAGNOSIS — J309 Allergic rhinitis, unspecified: Secondary | ICD-10-CM | POA: Diagnosis not present

## 2012-08-11 DIAGNOSIS — R197 Diarrhea, unspecified: Secondary | ICD-10-CM | POA: Diagnosis not present

## 2012-08-11 DIAGNOSIS — I10 Essential (primary) hypertension: Secondary | ICD-10-CM | POA: Diagnosis not present

## 2012-08-11 DIAGNOSIS — Z79899 Other long term (current) drug therapy: Secondary | ICD-10-CM | POA: Diagnosis not present

## 2012-08-11 DIAGNOSIS — I2510 Atherosclerotic heart disease of native coronary artery without angina pectoris: Secondary | ICD-10-CM | POA: Diagnosis not present

## 2012-08-11 DIAGNOSIS — E782 Mixed hyperlipidemia: Secondary | ICD-10-CM | POA: Diagnosis not present

## 2012-08-11 DIAGNOSIS — K589 Irritable bowel syndrome without diarrhea: Secondary | ICD-10-CM | POA: Diagnosis not present

## 2012-08-11 DIAGNOSIS — R634 Abnormal weight loss: Secondary | ICD-10-CM | POA: Diagnosis not present

## 2012-08-11 DIAGNOSIS — E559 Vitamin D deficiency, unspecified: Secondary | ICD-10-CM | POA: Diagnosis not present

## 2012-08-11 DIAGNOSIS — R7301 Impaired fasting glucose: Secondary | ICD-10-CM | POA: Diagnosis not present

## 2012-08-11 DIAGNOSIS — Z7901 Long term (current) use of anticoagulants: Secondary | ICD-10-CM | POA: Diagnosis not present

## 2012-09-15 DIAGNOSIS — Z86010 Personal history of colonic polyps: Secondary | ICD-10-CM | POA: Diagnosis not present

## 2012-09-15 DIAGNOSIS — K591 Functional diarrhea: Secondary | ICD-10-CM | POA: Diagnosis not present

## 2012-09-15 DIAGNOSIS — R634 Abnormal weight loss: Secondary | ICD-10-CM | POA: Diagnosis not present

## 2012-09-22 DIAGNOSIS — IMO0002 Reserved for concepts with insufficient information to code with codable children: Secondary | ICD-10-CM | POA: Diagnosis not present

## 2012-09-22 DIAGNOSIS — R197 Diarrhea, unspecified: Secondary | ICD-10-CM | POA: Diagnosis not present

## 2012-09-22 DIAGNOSIS — R7989 Other specified abnormal findings of blood chemistry: Secondary | ICD-10-CM | POA: Diagnosis not present

## 2012-09-22 DIAGNOSIS — Z23 Encounter for immunization: Secondary | ICD-10-CM | POA: Diagnosis not present

## 2012-09-22 DIAGNOSIS — Z1239 Encounter for other screening for malignant neoplasm of breast: Secondary | ICD-10-CM | POA: Diagnosis not present

## 2012-09-22 DIAGNOSIS — R319 Hematuria, unspecified: Secondary | ICD-10-CM | POA: Diagnosis not present

## 2012-09-22 DIAGNOSIS — F17200 Nicotine dependence, unspecified, uncomplicated: Secondary | ICD-10-CM | POA: Diagnosis not present

## 2012-09-22 DIAGNOSIS — Z13820 Encounter for screening for osteoporosis: Secondary | ICD-10-CM | POA: Diagnosis not present

## 2012-09-23 ENCOUNTER — Other Ambulatory Visit: Payer: Self-pay | Admitting: Family Medicine

## 2012-09-23 DIAGNOSIS — Z1231 Encounter for screening mammogram for malignant neoplasm of breast: Secondary | ICD-10-CM

## 2012-09-23 DIAGNOSIS — Z780 Asymptomatic menopausal state: Secondary | ICD-10-CM

## 2012-10-05 DIAGNOSIS — G243 Spasmodic torticollis: Secondary | ICD-10-CM | POA: Diagnosis not present

## 2012-10-14 DIAGNOSIS — Z86010 Personal history of colonic polyps: Secondary | ICD-10-CM | POA: Diagnosis not present

## 2012-10-14 DIAGNOSIS — D126 Benign neoplasm of colon, unspecified: Secondary | ICD-10-CM | POA: Diagnosis not present

## 2012-10-14 DIAGNOSIS — Z09 Encounter for follow-up examination after completed treatment for conditions other than malignant neoplasm: Secondary | ICD-10-CM | POA: Diagnosis not present

## 2012-10-14 DIAGNOSIS — K5730 Diverticulosis of large intestine without perforation or abscess without bleeding: Secondary | ICD-10-CM | POA: Diagnosis not present

## 2012-11-20 ENCOUNTER — Ambulatory Visit
Admission: RE | Admit: 2012-11-20 | Discharge: 2012-11-20 | Disposition: A | Discharge status: Home / Self Care | Payer: Medicare Other | Source: Ambulatory Visit | Attending: Family Medicine | Admitting: Family Medicine

## 2012-11-20 DIAGNOSIS — Z1231 Encounter for screening mammogram for malignant neoplasm of breast: Secondary | ICD-10-CM

## 2012-12-29 DIAGNOSIS — I2510 Atherosclerotic heart disease of native coronary artery without angina pectoris: Secondary | ICD-10-CM | POA: Diagnosis not present

## 2012-12-29 DIAGNOSIS — R7301 Impaired fasting glucose: Secondary | ICD-10-CM | POA: Diagnosis not present

## 2012-12-29 DIAGNOSIS — Z7901 Long term (current) use of anticoagulants: Secondary | ICD-10-CM | POA: Diagnosis not present

## 2012-12-29 DIAGNOSIS — F329 Major depressive disorder, single episode, unspecified: Secondary | ICD-10-CM | POA: Diagnosis not present

## 2012-12-29 DIAGNOSIS — E890 Postprocedural hypothyroidism: Secondary | ICD-10-CM | POA: Diagnosis not present

## 2012-12-29 DIAGNOSIS — E559 Vitamin D deficiency, unspecified: Secondary | ICD-10-CM | POA: Diagnosis not present

## 2012-12-29 DIAGNOSIS — Z79899 Other long term (current) drug therapy: Secondary | ICD-10-CM | POA: Diagnosis not present

## 2012-12-29 DIAGNOSIS — F3289 Other specified depressive episodes: Secondary | ICD-10-CM | POA: Diagnosis not present

## 2012-12-29 DIAGNOSIS — K219 Gastro-esophageal reflux disease without esophagitis: Secondary | ICD-10-CM | POA: Diagnosis not present

## 2012-12-29 DIAGNOSIS — I10 Essential (primary) hypertension: Secondary | ICD-10-CM | POA: Diagnosis not present

## 2012-12-29 DIAGNOSIS — E785 Hyperlipidemia, unspecified: Secondary | ICD-10-CM | POA: Diagnosis not present

## 2013-01-04 DIAGNOSIS — G243 Spasmodic torticollis: Secondary | ICD-10-CM | POA: Diagnosis not present

## 2013-04-11 DIAGNOSIS — T07XXXA Unspecified multiple injuries, initial encounter: Secondary | ICD-10-CM | POA: Diagnosis not present

## 2013-04-11 DIAGNOSIS — Z853 Personal history of malignant neoplasm of breast: Secondary | ICD-10-CM | POA: Diagnosis not present

## 2013-04-11 DIAGNOSIS — S6990XA Unspecified injury of unspecified wrist, hand and finger(s), initial encounter: Secondary | ICD-10-CM | POA: Diagnosis not present

## 2013-04-11 DIAGNOSIS — Z951 Presence of aortocoronary bypass graft: Secondary | ICD-10-CM | POA: Diagnosis not present

## 2013-04-11 DIAGNOSIS — X58XXXA Exposure to other specified factors, initial encounter: Secondary | ICD-10-CM | POA: Diagnosis not present

## 2013-04-11 DIAGNOSIS — M169 Osteoarthritis of hip, unspecified: Secondary | ICD-10-CM | POA: Diagnosis not present

## 2013-04-11 DIAGNOSIS — E785 Hyperlipidemia, unspecified: Secondary | ICD-10-CM | POA: Diagnosis not present

## 2013-04-11 DIAGNOSIS — S5000XA Contusion of unspecified elbow, initial encounter: Secondary | ICD-10-CM | POA: Diagnosis not present

## 2013-04-11 DIAGNOSIS — S7000XA Contusion of unspecified hip, initial encounter: Secondary | ICD-10-CM | POA: Diagnosis not present

## 2013-04-11 DIAGNOSIS — K5730 Diverticulosis of large intestine without perforation or abscess without bleeding: Secondary | ICD-10-CM | POA: Diagnosis not present

## 2013-04-11 DIAGNOSIS — Z043 Encounter for examination and observation following other accident: Secondary | ICD-10-CM | POA: Diagnosis not present

## 2013-04-11 DIAGNOSIS — S59919A Unspecified injury of unspecified forearm, initial encounter: Secondary | ICD-10-CM | POA: Diagnosis not present

## 2013-04-11 DIAGNOSIS — K219 Gastro-esophageal reflux disease without esophagitis: Secondary | ICD-10-CM | POA: Diagnosis not present

## 2013-04-11 DIAGNOSIS — I10 Essential (primary) hypertension: Secondary | ICD-10-CM | POA: Diagnosis not present

## 2013-04-11 DIAGNOSIS — S72109A Unspecified trochanteric fracture of unspecified femur, initial encounter for closed fracture: Secondary | ICD-10-CM | POA: Diagnosis not present

## 2013-04-11 DIAGNOSIS — Y33XXXA Other specified events, undetermined intent, initial encounter: Secondary | ICD-10-CM | POA: Diagnosis not present

## 2013-04-11 DIAGNOSIS — I7090 Unspecified atherosclerosis: Secondary | ICD-10-CM | POA: Diagnosis not present

## 2013-04-11 DIAGNOSIS — Z8679 Personal history of other diseases of the circulatory system: Secondary | ICD-10-CM | POA: Diagnosis not present

## 2013-04-11 DIAGNOSIS — IMO0002 Reserved for concepts with insufficient information to code with codable children: Secondary | ICD-10-CM | POA: Diagnosis not present

## 2013-06-14 DIAGNOSIS — G243 Spasmodic torticollis: Secondary | ICD-10-CM | POA: Diagnosis not present

## 2013-09-01 DIAGNOSIS — E559 Vitamin D deficiency, unspecified: Secondary | ICD-10-CM | POA: Diagnosis not present

## 2013-09-01 DIAGNOSIS — R748 Abnormal levels of other serum enzymes: Secondary | ICD-10-CM | POA: Diagnosis not present

## 2013-09-01 DIAGNOSIS — R7301 Impaired fasting glucose: Secondary | ICD-10-CM | POA: Diagnosis not present

## 2013-09-01 DIAGNOSIS — K219 Gastro-esophageal reflux disease without esophagitis: Secondary | ICD-10-CM | POA: Diagnosis not present

## 2013-09-01 DIAGNOSIS — E785 Hyperlipidemia, unspecified: Secondary | ICD-10-CM | POA: Diagnosis not present

## 2013-09-01 DIAGNOSIS — E890 Postprocedural hypothyroidism: Secondary | ICD-10-CM | POA: Diagnosis not present

## 2013-09-01 DIAGNOSIS — I2510 Atherosclerotic heart disease of native coronary artery without angina pectoris: Secondary | ICD-10-CM | POA: Diagnosis not present

## 2013-09-01 DIAGNOSIS — I10 Essential (primary) hypertension: Secondary | ICD-10-CM | POA: Diagnosis not present

## 2013-09-20 DIAGNOSIS — G243 Spasmodic torticollis: Secondary | ICD-10-CM | POA: Diagnosis not present

## 2013-12-15 ENCOUNTER — Other Ambulatory Visit: Payer: Self-pay

## 2013-12-15 DIAGNOSIS — Z1231 Encounter for screening mammogram for malignant neoplasm of breast: Secondary | ICD-10-CM

## 2013-12-15 DIAGNOSIS — Z853 Personal history of malignant neoplasm of breast: Secondary | ICD-10-CM

## 2013-12-15 DIAGNOSIS — Z9011 Acquired absence of right breast and nipple: Secondary | ICD-10-CM

## 2014-01-28 DIAGNOSIS — I2510 Atherosclerotic heart disease of native coronary artery without angina pectoris: Secondary | ICD-10-CM | POA: Diagnosis not present

## 2014-01-28 DIAGNOSIS — R7301 Impaired fasting glucose: Secondary | ICD-10-CM | POA: Diagnosis not present

## 2014-01-28 DIAGNOSIS — E559 Vitamin D deficiency, unspecified: Secondary | ICD-10-CM | POA: Diagnosis not present

## 2014-01-28 DIAGNOSIS — E890 Postprocedural hypothyroidism: Secondary | ICD-10-CM | POA: Diagnosis not present

## 2014-01-28 DIAGNOSIS — I10 Essential (primary) hypertension: Secondary | ICD-10-CM | POA: Diagnosis not present

## 2014-01-28 DIAGNOSIS — E785 Hyperlipidemia, unspecified: Secondary | ICD-10-CM | POA: Diagnosis not present

## 2014-01-28 DIAGNOSIS — R748 Abnormal levels of other serum enzymes: Secondary | ICD-10-CM | POA: Diagnosis not present

## 2014-01-28 DIAGNOSIS — Z79899 Other long term (current) drug therapy: Secondary | ICD-10-CM | POA: Diagnosis not present

## 2014-02-02 ENCOUNTER — Ambulatory Visit
Admission: RE | Admit: 2014-02-02 | Discharge: 2014-02-02 | Disposition: A | Discharge status: Home / Self Care | Payer: Medicare Other | Source: Ambulatory Visit

## 2014-02-02 ENCOUNTER — Other Ambulatory Visit: Payer: Self-pay

## 2014-02-02 DIAGNOSIS — Z9011 Acquired absence of right breast and nipple: Secondary | ICD-10-CM

## 2014-02-02 DIAGNOSIS — Z853 Personal history of malignant neoplasm of breast: Secondary | ICD-10-CM

## 2014-02-02 DIAGNOSIS — Z1231 Encounter for screening mammogram for malignant neoplasm of breast: Secondary | ICD-10-CM

## 2014-02-07 DIAGNOSIS — G243 Spasmodic torticollis: Secondary | ICD-10-CM | POA: Diagnosis not present

## 2014-07-04 DIAGNOSIS — M436 Torticollis: Secondary | ICD-10-CM | POA: Diagnosis not present

## 2014-07-04 DIAGNOSIS — G243 Spasmodic torticollis: Secondary | ICD-10-CM | POA: Diagnosis not present

## 2014-07-28 DIAGNOSIS — Z79899 Other long term (current) drug therapy: Secondary | ICD-10-CM | POA: Diagnosis not present

## 2014-07-28 DIAGNOSIS — I10 Essential (primary) hypertension: Secondary | ICD-10-CM | POA: Diagnosis not present

## 2014-07-28 DIAGNOSIS — E559 Vitamin D deficiency, unspecified: Secondary | ICD-10-CM | POA: Diagnosis not present

## 2014-07-28 DIAGNOSIS — R251 Tremor, unspecified: Secondary | ICD-10-CM | POA: Diagnosis not present

## 2014-07-28 DIAGNOSIS — I2510 Atherosclerotic heart disease of native coronary artery without angina pectoris: Secondary | ICD-10-CM | POA: Diagnosis not present

## 2014-07-28 DIAGNOSIS — E782 Mixed hyperlipidemia: Secondary | ICD-10-CM | POA: Diagnosis not present

## 2014-07-28 DIAGNOSIS — E890 Postprocedural hypothyroidism: Secondary | ICD-10-CM | POA: Diagnosis not present

## 2014-07-28 DIAGNOSIS — K219 Gastro-esophageal reflux disease without esophagitis: Secondary | ICD-10-CM | POA: Diagnosis not present

## 2014-10-05 DIAGNOSIS — H5213 Myopia, bilateral: Secondary | ICD-10-CM | POA: Diagnosis not present

## 2014-10-05 DIAGNOSIS — H2511 Age-related nuclear cataract, right eye: Secondary | ICD-10-CM | POA: Diagnosis not present

## 2014-10-05 DIAGNOSIS — H524 Presbyopia: Secondary | ICD-10-CM | POA: Diagnosis not present

## 2014-10-05 DIAGNOSIS — H25011 Cortical age-related cataract, right eye: Secondary | ICD-10-CM | POA: Diagnosis not present

## 2014-10-05 DIAGNOSIS — H40013 Open angle with borderline findings, low risk, bilateral: Secondary | ICD-10-CM | POA: Diagnosis not present

## 2014-10-25 DIAGNOSIS — H2513 Age-related nuclear cataract, bilateral: Secondary | ICD-10-CM | POA: Diagnosis not present

## 2014-10-25 DIAGNOSIS — H2511 Age-related nuclear cataract, right eye: Secondary | ICD-10-CM | POA: Diagnosis not present

## 2014-11-14 DIAGNOSIS — H2512 Age-related nuclear cataract, left eye: Secondary | ICD-10-CM | POA: Diagnosis not present

## 2014-11-14 DIAGNOSIS — H25012 Cortical age-related cataract, left eye: Secondary | ICD-10-CM | POA: Diagnosis not present

## 2014-11-22 DIAGNOSIS — H2512 Age-related nuclear cataract, left eye: Secondary | ICD-10-CM | POA: Diagnosis not present

## 2014-12-25 HISTORY — PX: CATARACT EXTRACTION: SUR2

## 2015-01-02 DIAGNOSIS — M436 Torticollis: Secondary | ICD-10-CM | POA: Diagnosis not present

## 2015-01-25 DIAGNOSIS — R7301 Impaired fasting glucose: Secondary | ICD-10-CM | POA: Diagnosis not present

## 2015-01-25 DIAGNOSIS — I2510 Atherosclerotic heart disease of native coronary artery without angina pectoris: Secondary | ICD-10-CM | POA: Diagnosis not present

## 2015-01-25 DIAGNOSIS — K589 Irritable bowel syndrome without diarrhea: Secondary | ICD-10-CM | POA: Diagnosis not present

## 2015-01-25 DIAGNOSIS — E559 Vitamin D deficiency, unspecified: Secondary | ICD-10-CM | POA: Diagnosis not present

## 2015-01-25 DIAGNOSIS — E890 Postprocedural hypothyroidism: Secondary | ICD-10-CM | POA: Diagnosis not present

## 2015-01-25 DIAGNOSIS — E785 Hyperlipidemia, unspecified: Secondary | ICD-10-CM | POA: Diagnosis not present

## 2015-01-25 DIAGNOSIS — I10 Essential (primary) hypertension: Secondary | ICD-10-CM | POA: Diagnosis not present

## 2015-01-25 DIAGNOSIS — K219 Gastro-esophageal reflux disease without esophagitis: Secondary | ICD-10-CM | POA: Diagnosis not present

## 2015-02-02 ENCOUNTER — Other Ambulatory Visit: Payer: Self-pay

## 2015-02-02 DIAGNOSIS — Z1231 Encounter for screening mammogram for malignant neoplasm of breast: Secondary | ICD-10-CM

## 2015-03-03 ENCOUNTER — Ambulatory Visit
Admission: RE | Admit: 2015-03-03 | Discharge: 2015-03-03 | Disposition: A | Discharge status: Home / Self Care | Payer: Medicare Other | Source: Ambulatory Visit

## 2015-03-03 DIAGNOSIS — Z1231 Encounter for screening mammogram for malignant neoplasm of breast: Secondary | ICD-10-CM

## 2015-03-03 DIAGNOSIS — Z9011 Acquired absence of right breast and nipple: Secondary | ICD-10-CM | POA: Diagnosis not present

## 2015-03-06 ENCOUNTER — Other Ambulatory Visit: Payer: Self-pay | Admitting: Family Medicine

## 2015-03-06 DIAGNOSIS — R928 Other abnormal and inconclusive findings on diagnostic imaging of breast: Secondary | ICD-10-CM

## 2015-03-15 ENCOUNTER — Ambulatory Visit
Admission: RE | Admit: 2015-03-15 | Discharge: 2015-03-15 | Disposition: A | Discharge status: Home / Self Care | Payer: Medicare Other | Source: Ambulatory Visit | Attending: Family Medicine | Admitting: Family Medicine

## 2015-03-15 DIAGNOSIS — R928 Other abnormal and inconclusive findings on diagnostic imaging of breast: Secondary | ICD-10-CM

## 2015-03-21 DIAGNOSIS — M5410 Radiculopathy, site unspecified: Secondary | ICD-10-CM | POA: Diagnosis not present

## 2015-03-29 DIAGNOSIS — M5416 Radiculopathy, lumbar region: Secondary | ICD-10-CM | POA: Diagnosis not present

## 2015-03-29 DIAGNOSIS — M791 Myalgia: Secondary | ICD-10-CM | POA: Diagnosis not present

## 2015-04-07 ENCOUNTER — Ambulatory Visit
Admission: RE | Admit: 2015-04-07 | Discharge: 2015-04-07 | Disposition: A | Discharge status: Home / Self Care | Payer: Medicare Other | Source: Ambulatory Visit | Attending: Family Medicine | Admitting: Family Medicine

## 2015-04-07 ENCOUNTER — Other Ambulatory Visit: Payer: Self-pay | Admitting: Family Medicine

## 2015-04-07 DIAGNOSIS — M160 Bilateral primary osteoarthritis of hip: Secondary | ICD-10-CM | POA: Diagnosis not present

## 2015-04-07 DIAGNOSIS — M4186 Other forms of scoliosis, lumbar region: Secondary | ICD-10-CM | POA: Diagnosis not present

## 2015-04-07 DIAGNOSIS — M545 Low back pain: Secondary | ICD-10-CM

## 2015-04-07 DIAGNOSIS — M7918 Myalgia, other site: Secondary | ICD-10-CM

## 2015-04-07 DIAGNOSIS — Z853 Personal history of malignant neoplasm of breast: Secondary | ICD-10-CM | POA: Diagnosis not present

## 2015-04-07 DIAGNOSIS — M791 Myalgia: Secondary | ICD-10-CM | POA: Diagnosis not present

## 2015-04-07 DIAGNOSIS — M5136 Other intervertebral disc degeneration, lumbar region: Secondary | ICD-10-CM | POA: Diagnosis not present

## 2015-04-17 DIAGNOSIS — M436 Torticollis: Secondary | ICD-10-CM | POA: Diagnosis not present

## 2015-07-28 DIAGNOSIS — R7301 Impaired fasting glucose: Secondary | ICD-10-CM | POA: Diagnosis not present

## 2015-07-28 DIAGNOSIS — K219 Gastro-esophageal reflux disease without esophagitis: Secondary | ICD-10-CM | POA: Diagnosis not present

## 2015-07-28 DIAGNOSIS — R319 Hematuria, unspecified: Secondary | ICD-10-CM | POA: Diagnosis not present

## 2015-07-28 DIAGNOSIS — E890 Postprocedural hypothyroidism: Secondary | ICD-10-CM | POA: Diagnosis not present

## 2015-07-28 DIAGNOSIS — E559 Vitamin D deficiency, unspecified: Secondary | ICD-10-CM | POA: Diagnosis not present

## 2015-07-28 DIAGNOSIS — E785 Hyperlipidemia, unspecified: Secondary | ICD-10-CM | POA: Diagnosis not present

## 2015-07-28 DIAGNOSIS — R748 Abnormal levels of other serum enzymes: Secondary | ICD-10-CM | POA: Diagnosis not present

## 2015-07-28 DIAGNOSIS — I10 Essential (primary) hypertension: Secondary | ICD-10-CM | POA: Diagnosis not present

## 2015-07-28 DIAGNOSIS — I2510 Atherosclerotic heart disease of native coronary artery without angina pectoris: Secondary | ICD-10-CM | POA: Diagnosis not present

## 2015-07-31 DIAGNOSIS — G243 Spasmodic torticollis: Secondary | ICD-10-CM | POA: Diagnosis not present

## 2015-12-18 DIAGNOSIS — M436 Torticollis: Secondary | ICD-10-CM | POA: Diagnosis not present

## 2016-03-25 DIAGNOSIS — G252 Other specified forms of tremor: Secondary | ICD-10-CM | POA: Diagnosis not present

## 2016-03-25 DIAGNOSIS — M436 Torticollis: Secondary | ICD-10-CM | POA: Diagnosis not present

## 2016-03-26 DIAGNOSIS — I10 Essential (primary) hypertension: Secondary | ICD-10-CM | POA: Diagnosis not present

## 2016-03-26 DIAGNOSIS — R319 Hematuria, unspecified: Secondary | ICD-10-CM | POA: Diagnosis not present

## 2016-03-26 DIAGNOSIS — R1310 Dysphagia, unspecified: Secondary | ICD-10-CM | POA: Diagnosis not present

## 2016-03-26 DIAGNOSIS — Z79899 Other long term (current) drug therapy: Secondary | ICD-10-CM | POA: Diagnosis not present

## 2016-03-26 DIAGNOSIS — G250 Essential tremor: Secondary | ICD-10-CM | POA: Diagnosis not present

## 2016-03-26 DIAGNOSIS — K219 Gastro-esophageal reflux disease without esophagitis: Secondary | ICD-10-CM | POA: Diagnosis not present

## 2016-03-26 DIAGNOSIS — E559 Vitamin D deficiency, unspecified: Secondary | ICD-10-CM | POA: Diagnosis not present

## 2016-03-26 DIAGNOSIS — R748 Abnormal levels of other serum enzymes: Secondary | ICD-10-CM | POA: Diagnosis not present

## 2016-03-26 DIAGNOSIS — I2510 Atherosclerotic heart disease of native coronary artery without angina pectoris: Secondary | ICD-10-CM | POA: Diagnosis not present

## 2016-03-26 DIAGNOSIS — R7301 Impaired fasting glucose: Secondary | ICD-10-CM | POA: Diagnosis not present

## 2016-03-26 DIAGNOSIS — E785 Hyperlipidemia, unspecified: Secondary | ICD-10-CM | POA: Diagnosis not present

## 2016-03-26 DIAGNOSIS — E890 Postprocedural hypothyroidism: Secondary | ICD-10-CM | POA: Diagnosis not present

## 2016-03-27 ENCOUNTER — Other Ambulatory Visit: Payer: Self-pay | Admitting: Family Medicine

## 2016-03-27 DIAGNOSIS — R1310 Dysphagia, unspecified: Secondary | ICD-10-CM

## 2016-03-28 ENCOUNTER — Ambulatory Visit
Admission: RE | Admit: 2016-03-28 | Discharge: 2016-03-28 | Disposition: A | Discharge status: Home / Self Care | Payer: Medicare Other | Source: Ambulatory Visit | Attending: Family Medicine | Admitting: Family Medicine

## 2016-03-28 ENCOUNTER — Other Ambulatory Visit: Payer: Self-pay | Admitting: Family Medicine

## 2016-03-28 DIAGNOSIS — R1310 Dysphagia, unspecified: Secondary | ICD-10-CM

## 2016-03-28 DIAGNOSIS — E042 Nontoxic multinodular goiter: Secondary | ICD-10-CM | POA: Diagnosis not present

## 2016-03-28 DIAGNOSIS — F17200 Nicotine dependence, unspecified, uncomplicated: Secondary | ICD-10-CM

## 2016-03-28 DIAGNOSIS — R634 Abnormal weight loss: Secondary | ICD-10-CM

## 2016-03-28 DIAGNOSIS — R918 Other nonspecific abnormal finding of lung field: Secondary | ICD-10-CM | POA: Diagnosis not present

## 2016-04-04 ENCOUNTER — Other Ambulatory Visit: Payer: Self-pay | Admitting: Family Medicine

## 2016-04-04 DIAGNOSIS — E041 Nontoxic single thyroid nodule: Secondary | ICD-10-CM

## 2016-04-04 DIAGNOSIS — Z853 Personal history of malignant neoplasm of breast: Secondary | ICD-10-CM

## 2016-04-05 ENCOUNTER — Other Ambulatory Visit: Payer: Self-pay | Admitting: Physician Assistant

## 2016-04-05 DIAGNOSIS — E041 Nontoxic single thyroid nodule: Secondary | ICD-10-CM | POA: Diagnosis not present

## 2016-04-05 DIAGNOSIS — R1310 Dysphagia, unspecified: Secondary | ICD-10-CM

## 2016-04-05 DIAGNOSIS — K222 Esophageal obstruction: Secondary | ICD-10-CM | POA: Diagnosis not present

## 2016-04-05 DIAGNOSIS — R1313 Dysphagia, pharyngeal phase: Secondary | ICD-10-CM | POA: Diagnosis not present

## 2016-04-09 ENCOUNTER — Other Ambulatory Visit: Payer: Self-pay | Admitting: Family Medicine

## 2016-04-09 DIAGNOSIS — E041 Nontoxic single thyroid nodule: Secondary | ICD-10-CM

## 2016-04-12 ENCOUNTER — Ambulatory Visit
Admission: RE | Admit: 2016-04-12 | Discharge: 2016-04-12 | Disposition: A | Discharge status: Home / Self Care | Payer: Medicare Other | Source: Ambulatory Visit | Attending: Physician Assistant | Admitting: Physician Assistant

## 2016-04-12 ENCOUNTER — Ambulatory Visit
Admission: RE | Admit: 2016-04-12 | Discharge: 2016-04-12 | Disposition: A | Discharge status: Home / Self Care | Payer: Medicare Other | Source: Ambulatory Visit | Attending: Family Medicine | Admitting: Family Medicine

## 2016-04-12 DIAGNOSIS — R1310 Dysphagia, unspecified: Secondary | ICD-10-CM | POA: Diagnosis not present

## 2016-04-12 DIAGNOSIS — R918 Other nonspecific abnormal finding of lung field: Secondary | ICD-10-CM | POA: Diagnosis not present

## 2016-04-12 DIAGNOSIS — Z853 Personal history of malignant neoplasm of breast: Secondary | ICD-10-CM

## 2016-04-12 DIAGNOSIS — K224 Dyskinesia of esophagus: Secondary | ICD-10-CM | POA: Diagnosis not present

## 2016-04-12 MED ORDER — IOPAMIDOL (ISOVUE-300) INJECTION 61%
75.0000 mL | Freq: Once | INTRAVENOUS | Status: AC | PRN
  Administered 2016-04-12: 75 mL via INTRAVENOUS

## 2016-04-18 ENCOUNTER — Ambulatory Visit
Admission: RE | Admit: 2016-04-18 | Discharge: 2016-04-18 | Disposition: A | Discharge status: Home / Self Care | Payer: Medicare Other | Source: Ambulatory Visit | Attending: Family Medicine | Admitting: Family Medicine

## 2016-04-18 ENCOUNTER — Other Ambulatory Visit (HOSPITAL_COMMUNITY)
Admission: RE | Admit: 2016-04-18 | Discharge: 2016-04-18 | Disposition: A | Discharge status: Home / Self Care | Payer: Medicare Other | Source: Ambulatory Visit | Attending: General Surgery | Admitting: General Surgery

## 2016-04-18 DIAGNOSIS — E041 Nontoxic single thyroid nodule: Secondary | ICD-10-CM

## 2016-04-19 ENCOUNTER — Other Ambulatory Visit (HOSPITAL_COMMUNITY): Payer: Self-pay | Admitting: Physician Assistant

## 2016-04-19 DIAGNOSIS — R1310 Dysphagia, unspecified: Secondary | ICD-10-CM

## 2016-04-26 ENCOUNTER — Ambulatory Visit (HOSPITAL_COMMUNITY): Payer: Medicare Other

## 2016-04-26 ENCOUNTER — Other Ambulatory Visit (HOSPITAL_COMMUNITY): Payer: Medicare Other

## 2016-05-10 ENCOUNTER — Ambulatory Visit (HOSPITAL_COMMUNITY)
Admission: RE | Admit: 2016-05-10 | Discharge: 2016-05-10 | Disposition: A | Discharge status: Home / Self Care | Payer: Medicare Other | Source: Ambulatory Visit | Attending: Physician Assistant | Admitting: Physician Assistant

## 2016-05-10 DIAGNOSIS — R1310 Dysphagia, unspecified: Secondary | ICD-10-CM

## 2016-05-31 ENCOUNTER — Other Ambulatory Visit: Payer: Self-pay | Admitting: Family Medicine

## 2016-05-31 DIAGNOSIS — Z1231 Encounter for screening mammogram for malignant neoplasm of breast: Secondary | ICD-10-CM

## 2016-06-14 ENCOUNTER — Ambulatory Visit
Admission: RE | Admit: 2016-06-14 | Discharge: 2016-06-14 | Disposition: A | Discharge status: Home / Self Care | Payer: Medicare Other | Source: Ambulatory Visit | Attending: Family Medicine | Admitting: Family Medicine

## 2016-06-14 DIAGNOSIS — Z1231 Encounter for screening mammogram for malignant neoplasm of breast: Secondary | ICD-10-CM | POA: Diagnosis not present

## 2016-07-08 DIAGNOSIS — M436 Torticollis: Secondary | ICD-10-CM | POA: Diagnosis not present

## 2016-07-08 DIAGNOSIS — G252 Other specified forms of tremor: Secondary | ICD-10-CM | POA: Diagnosis not present

## 2016-07-11 DIAGNOSIS — I2510 Atherosclerotic heart disease of native coronary artery without angina pectoris: Secondary | ICD-10-CM | POA: Diagnosis not present

## 2016-07-11 DIAGNOSIS — E785 Hyperlipidemia, unspecified: Secondary | ICD-10-CM | POA: Diagnosis not present

## 2016-07-11 DIAGNOSIS — Z853 Personal history of malignant neoplasm of breast: Secondary | ICD-10-CM | POA: Diagnosis not present

## 2016-07-11 DIAGNOSIS — M436 Torticollis: Secondary | ICD-10-CM | POA: Diagnosis not present

## 2016-07-11 DIAGNOSIS — K219 Gastro-esophageal reflux disease without esophagitis: Secondary | ICD-10-CM | POA: Diagnosis not present

## 2016-07-11 DIAGNOSIS — I10 Essential (primary) hypertension: Secondary | ICD-10-CM | POA: Diagnosis not present

## 2016-07-11 DIAGNOSIS — Z79899 Other long term (current) drug therapy: Secondary | ICD-10-CM | POA: Diagnosis not present

## 2016-11-11 DIAGNOSIS — G252 Other specified forms of tremor: Secondary | ICD-10-CM | POA: Diagnosis not present

## 2016-11-11 DIAGNOSIS — M436 Torticollis: Secondary | ICD-10-CM | POA: Diagnosis not present

## 2016-11-12 DIAGNOSIS — J302 Other seasonal allergic rhinitis: Secondary | ICD-10-CM | POA: Diagnosis not present

## 2016-11-12 DIAGNOSIS — E559 Vitamin D deficiency, unspecified: Secondary | ICD-10-CM | POA: Diagnosis not present

## 2016-11-12 DIAGNOSIS — Z79899 Other long term (current) drug therapy: Secondary | ICD-10-CM | POA: Diagnosis not present

## 2016-11-12 DIAGNOSIS — E785 Hyperlipidemia, unspecified: Secondary | ICD-10-CM | POA: Diagnosis not present

## 2016-11-19 DIAGNOSIS — M67271 Synovial hypertrophy, not elsewhere classified, right ankle and foot: Secondary | ICD-10-CM | POA: Diagnosis not present

## 2016-11-19 DIAGNOSIS — D485 Neoplasm of uncertain behavior of skin: Secondary | ICD-10-CM | POA: Diagnosis not present

## 2016-11-19 DIAGNOSIS — Z85828 Personal history of other malignant neoplasm of skin: Secondary | ICD-10-CM | POA: Diagnosis not present

## 2017-02-17 DIAGNOSIS — M436 Torticollis: Secondary | ICD-10-CM | POA: Diagnosis not present

## 2017-08-12 DIAGNOSIS — J209 Acute bronchitis, unspecified: Secondary | ICD-10-CM | POA: Diagnosis not present

## 2017-08-12 DIAGNOSIS — M436 Torticollis: Secondary | ICD-10-CM | POA: Diagnosis not present

## 2017-08-12 DIAGNOSIS — K219 Gastro-esophageal reflux disease without esophagitis: Secondary | ICD-10-CM | POA: Diagnosis not present

## 2017-08-12 DIAGNOSIS — I10 Essential (primary) hypertension: Secondary | ICD-10-CM | POA: Diagnosis not present

## 2017-08-12 DIAGNOSIS — E7800 Pure hypercholesterolemia, unspecified: Secondary | ICD-10-CM | POA: Diagnosis not present

## 2017-08-13 ENCOUNTER — Encounter: Payer: Self-pay | Admitting: Neurology

## 2017-09-03 ENCOUNTER — Encounter: Payer: Self-pay | Admitting: Neurology

## 2017-09-04 NOTE — Progress Notes (Signed)
Courtney Larson was seen today in neurologic consultation at the request of [name], [name], MD.  The consultation is for the evaluation of cervical dystonia.  Pt previously seen by Dr. [name].  I have reviewed records made available to me.  She was last seen on February 17, 2017.   I can see her Botox notes as far back as 2016, but there was a period of time where there was a lag in injections.  Her daughter reports that she may have been in [place] visiting her other daughter when the "lag" in injections is noted, but as far as she knows she has been getting injections regularly for many years.  She states that she has been receiving injections for about 18 years (16 years here and a few prior to that in [place]).  When her symptoms first started, she noted that her neck hurt and her right ear went toward the right shoulder.  She has more tremor of the head now but less pain in the neck than she did in the past.  Reports that botox injections were very painful and seemed to "settle" in her right shoulder blade and she asked Dr. [name] to start doing injections there and it seemed to help.  According to notes, she had 20 units in rhomboid major.    ALLERGIES:   Allergies  Allergen Reactions  . Artane [Trihexyphenidyl]     Lip swelling  . Lopid [Gemfibrozil]     Leg cramps  . Zocor [Simvastatin]     Leg cramps    CURRENT MEDICATIONS:  Outpatient Encounter Medications as of 09/08/2017  Medication Sig  . aspirin EC 81 MG tablet Take 81 mg by mouth daily.  . fenofibrate (TRICOR) 145 MG tablet Take 145 mg by mouth daily.  . metoprolol succinate (TOPROL-XL) 50 MG 24 hr tablet Take 50 mg by mouth daily. Take with or immediately following a meal.  . pantoprazole (PROTONIX) 40 MG tablet Take 40 mg by mouth daily.  . nitroGLYCERIN (NITROSTAT) 0.4 MG SL tablet Place 0.4 mg under the tongue every 5 (five) minutes as needed for chest pain.   No facility-administered encounter medications on file as of  09/08/2017.     PAST MEDICAL HISTORY:   Past Medical History:  Diagnosis Date  . Breast cancer ([place])   . CAD (coronary artery disease)   . Cervical dystonia   . COPD (chronic obstructive pulmonary disease) ([place])   . Depression   . GERD (gastroesophageal reflux disease)   . Goiter   . Hiatal hernia   . HTN (hypertension)   . Hypercholesteremia   . Lung metastases ([place]) 1992  . PUD (peptic ulcer disease)   . Tremor     PAST SURGICAL HISTORY:   Past Surgical History:  Procedure Laterality Date  . ANGIOPLASTY  07/2001  . APPENDECTOMY    . CATARACT EXTRACTION  12/2014  . LUNG REMOVAL, PARTIAL  1997  . MASTECTOMY Right 1996  . ROTATOR CUFF REPAIR Right 2003  . TONSILLECTOMY      SOCIAL HISTORY:   Social History   Socioeconomic History  . Marital status: Single    Spouse name: Not on file  . Number of children: Not on file  . Years of education: Not on file  . Highest education level: Not on file  Social Needs  . Financial resource strain: Not on file  . Food insecurity - worry: Not on file  . Food insecurity - inability: Not on file  .  Transportation needs - medical: Not on file  . Transportation needs - non-medical: Not on file  Occupational History  . Not on file  Tobacco Use  . Smoking status: Current Every Day Smoker  . Smokeless tobacco: Never Used  . Tobacco comment: 5 cigarettes per day  Substance and Sexual Activity  . Alcohol use: No    Frequency: Never  . Drug use: No  . Sexual activity: Not on file  Other Topics Concern  . Not on file  Social History Narrative  . Not on file    FAMILY HISTORY:   Family Status  Relation Name Status  . Mother  Deceased  . Father  Deceased  . Sister  Alive  . Brother  Deceased  . Brother  Deceased  . Brother  Deceased  . Sister  Deceased  . Brother 2 Alive  . Daughter 3 Alive    ROS:  A complete 10 system review of systems was obtained and was unremarkable apart from what is mentioned above.  PHYSICAL  EXAMINATION:    VITALS:   Vitals:   09/08/17 0954  BP: (!) 158/90  Pulse: 70  SpO2: 98%  Weight: 124 lb (56.2 kg)  Height: 5' 2.5" (1.588 m)    GEN:  Normal appears female in no acute distress.  Appears stated age. HEENT:  Normocephalic, atraumatic. The mucous membranes are moist. The superficial temporal arteries are without ropiness or tenderness. Cardiovascular: Regular rate and rhythm. Lungs: Clear to auscultation bilaterally. Neck/Heme: There are no carotid bruits noted bilaterally.  Neck is turned to the left with complex head titubation.  NEUROLOGICAL: Orientation:  The patient is alert and oriented x 3.  Fund of knowledge is appropriate.  Recent and remote memory intact.  Attention span and concentration normal.  Repeats and names without difficulty. Cranial nerves: There is good facial symmetry. The pupils are equal round and reactive to light bilaterally. Fundoscopic exam reveals clear disc margins bilaterally. Extraocular muscles are intact and visual fields are full to confrontational testing. Speech is fluent and clear. Soft palate rises symmetrically and there is no tongue deviation. Hearing is intact to conversational tone. Tone: Tone is good throughout. Sensation: Sensation is intact to light touch and pinprick throughout (facial, trunk, extremities). Vibration is intact at the bilateral big toe. There is no extinction with double simultaneous stimulation. There is no sensory dermatomal level identified. Coordination:  The patient has no difficulty with RAM's or FNF bilaterally. Motor: Strength is 5/5 in the bilateral upper and lower extremities.  Shoulder shrug is equal and symmetric. There is no pronator drift.  There are no fasciculations noted. DTR's: Deep tendon reflexes are 2/4 at the bilateral biceps, triceps, brachioradialis, patella and achilles.  Plantar responses are downgoing bilaterally. Gait and Station: The patient is able to ambulate without difficulty. The  patient is able to heel toe walk without any difficulty. The patient is able to ambulate in a tandem fashion. The patient is able to stand in the Romberg position.   IMPRESSION/PLAN  1. Cervical Dystonia  -I talked to the patient about the nature and pathophysiology.  The patient is having trouble with ADL's and with rotation of the head in daily life for driving.  The primary muscles involved are the right SCM and left splenius capitus.  We talked about treatments.  We talked about the value of botox.  The patient was educated on the botulinum toxin the black blox warning and given a copy of the botox patient medication guide.  The patient understands that this warning states that there have been reported cases of the Botox extending beyond the injection site and creating adverse effects, similar to those of botulism. This included loss of strength, trouble walking, hoarseness, trouble saying words clearly, loss of bladder control, trouble breathing, trouble swallowing, diplopia, blurry vision and ptosis. Most of the distant spread of Botox was happening in patients, primarily children, who received medication for spasticity or for cervical dystonia. The patient expressed understanding and desire to proceed.  I will try to get authorization.  She has had botox for many, many years with other providers.  I have reviewed injection pattern.  She was having equal dosages of botox put into the L and R SCM.  I think that we will stick with the R SCM and left splenius for now and see how she does.  Explained in detail to her that I will not use EMG guidance as I don't think that we need to use it and she agreed.    2.  Tobacco abuse  -Currently smoking 5 cigarettes per day with remote history of breast cancer that metastasized to a lung period  - Patient was informed of the dangers of tobacco abuse including stroke, cancer, and MI, as well as benefits of tobacco cessation.  - Patient not sure that she "can"  quit smoking but willing to try.    - Approximately 3 mins were spent counseling patient cessation techniques (independent of visit time). We discussed various methods to help quit smoking, including deciding on a date to quit, joining a support group, pharmacological agents- nicotine gum/patch/lozenges, chantix,   - I will reassess her progress at future visit.  3.  Mod complexity visit.  F/u for reassess after botox.  Cc:  [name], [name], MD

## 2017-09-08 ENCOUNTER — Encounter: Payer: Self-pay | Admitting: Neurology

## 2017-09-08 ENCOUNTER — Ambulatory Visit (INDEPENDENT_AMBULATORY_CARE_PROVIDER_SITE_OTHER): Payer: Medicare Other | Admitting: Neurology

## 2017-09-08 VITALS — BP 158/90 | HR 70 | Ht 62.5 in | Wt 124.0 lb

## 2017-09-08 DIAGNOSIS — Z720 Tobacco use: Secondary | ICD-10-CM | POA: Diagnosis not present

## 2017-09-08 DIAGNOSIS — G243 Spasmodic torticollis: Secondary | ICD-10-CM | POA: Diagnosis not present

## 2017-09-08 DIAGNOSIS — F17210 Nicotine dependence, cigarettes, uncomplicated: Secondary | ICD-10-CM | POA: Diagnosis not present

## 2017-09-23 ENCOUNTER — Telehealth: Payer: Self-pay | Admitting: Neurology

## 2017-09-23 NOTE — Telephone Encounter (Signed)
Patient

## 2017-09-23 NOTE — Telephone Encounter (Signed)
Tat patient. 

## 2017-09-23 NOTE — Telephone Encounter (Signed)
Sorry about the first message hit wrong button.  Patient wants to check the status of her being approved for botox please call patient

## 2017-09-24 NOTE — Telephone Encounter (Signed)
Patient made aware insurance is approved. She is on the schedule for April and was put on cancellation list for sooner if needed.

## 2017-10-03 ENCOUNTER — Telehealth: Payer: Self-pay | Admitting: Neurology

## 2017-10-03 NOTE — Telephone Encounter (Signed)
Patient called and she is in a lot of pain. She said she had her last Botox in June. She is scheduled to come in April. Is there any Botox appts sooner? Please Advise. Thanks

## 2017-10-03 NOTE — Telephone Encounter (Signed)
No sooner appts available at this time. I tried to call patient to make her aware but no answer and no voicemail. I believe she is already on the cancellation list.

## 2017-10-23 DIAGNOSIS — H3509 Other intraretinal microvascular abnormalities: Secondary | ICD-10-CM | POA: Diagnosis not present

## 2017-10-23 DIAGNOSIS — H3561 Retinal hemorrhage, right eye: Secondary | ICD-10-CM | POA: Diagnosis not present

## 2017-10-23 DIAGNOSIS — H35033 Hypertensive retinopathy, bilateral: Secondary | ICD-10-CM | POA: Diagnosis not present

## 2017-10-23 DIAGNOSIS — H40013 Open angle with borderline findings, low risk, bilateral: Secondary | ICD-10-CM | POA: Diagnosis not present

## 2017-12-19 ENCOUNTER — Encounter

## 2017-12-19 ENCOUNTER — Ambulatory Visit (INDEPENDENT_AMBULATORY_CARE_PROVIDER_SITE_OTHER): Payer: Medicare Other | Admitting: Neurology

## 2017-12-19 DIAGNOSIS — G243 Spasmodic torticollis: Secondary | ICD-10-CM | POA: Diagnosis not present

## 2017-12-19 MED ORDER — ONABOTULINUMTOXINA 100 UNITS IJ SOLR
200.0000 [IU] | Freq: Once | INTRAMUSCULAR | Status: AC
  Administered 2017-12-19: 200 [IU] via INTRAMUSCULAR

## 2017-12-19 NOTE — Procedures (Signed)
[facility]   Procedure Note Botox  Attending: Dr. Wells Guiles Scorpio Fortin  Preoperative Diagnosis(es): Cervical Dystonia  Result History  Onset of effect: n/a  Duration of Benefit: n/a  Adverse Effects: n/a  Been 10 months since last botox so is having much more pain and tremor per pt.   Consent obtained from: The patient Benefits discussed included, but were not limited to decreased muscle tightness, increased joint range of motion, and decreased pain.  Risk discussed included, but were not limited pain and discomfort, bleeding, bruising, excessive weakness, venous thrombosis, muscle atrophy and dysphagia.  A copy of the patient medication guide was given to the patient which explains the blackbox warning.  Patients identity and treatment sites confirmed Yes.  .  Details of Procedure: Skin was cleaned with alcohol.  A 30 gauge, 35mm  needle was introduced to the target muscle, except for posterior splenius where 27 gauge, 1.5 inch needle used.   Prior to injection, the needle plunger was aspirated to make sure the needle was not within a blood vessel.  There was no blood retrieved on aspiration.    Following is a summary of the muscles injected  And the amount of Botulinum toxin used:   Dilution 0.9% preservative free saline mixed with 100 u Botox type A to make 10 U per 0.1cc  Injections  Location Left  Right Units Number of sites        Sternocleidomastoid  50 50 1  Splenius Capitus, posterior approach 70  70 1  Splenius Capitus, lateral approach 30 0 30 1  Levator Scapulae      Trapezius 15 15 30 1  each  Rhomboid major  20 20 1   TOTAL UNITS:   200    Agent: Botulinum Type A ( Onobotulinum Toxin type A ).  2 vials of Botox were used, each containing 100 units and freshly diluted with 1 mL of sterile, non-preserved saline   Total injected (Units): 200  Total wasted (Units): none wasted   Pt tolerated procedure well without complications.   Reinjection is anticipated in 3  months.

## 2018-01-27 NOTE — Progress Notes (Signed)
Courtney Larson was seen today in neurologic consultation at the request of [name], [name], MD.  The consultation is for the evaluation of cervical dystonia.  Pt previously seen by Dr. [name].  I have reviewed records made available to me.  She was last seen on February 17, 2017.   I can see her Botox notes as far back as 2016, but there was a period of time where there was a lag in injections.  Her daughter reports that she may have been in [place] visiting her other daughter when the "lag" in injections is noted, but as far as she knows she has been getting injections regularly for many years.  She states that she has been receiving injections for about 18 years (16 years here and a few prior to that in [place]).  When her symptoms first started, she noted that her neck hurt and her right ear went toward the right shoulder.  She has more tremor of the head now but less pain in the neck than she did in the past.  Reports that botox injections were very painful and seemed to "settle" in her right shoulder blade and she asked Dr. [name] to start doing injections there and it seemed to help.  According to notes, she had 20 units in rhomboid major.  01/28/18 update: Patient is seen today for follow-up of cervical dystonia.  She had her first injections with me on December 19, 2017, although she has been getting injections for many years prior to that.  She reports that it kicked in on 12/25/17 and "I could feel my head clearing up."  On May 3, however, she had soreness of the trap mm bilaterally.  States that on May 10, she had a terrible pain in the neck and had trouble moving the neck.  She had similar pains in the past in the rhomboid but never in the trap mm.  She states that it lasted 2-3 hours but was much better the following day.  Otherwise, she is doing remarkably well.  Head is not shaking.  She is very pleased.  When asked about tobacco, she states that she is really trying to cut down.  There are some days that  she smokes no cigarettes and then she will have a few cigarettes in 1 day.    ALLERGIES:   Allergies  Allergen Reactions  . Artane [Trihexyphenidyl]     Lip swelling  . Lopid [Gemfibrozil]     Leg cramps  . Zocor [Simvastatin]     Leg cramps    CURRENT MEDICATIONS:  Outpatient Encounter Medications as of 01/28/2018  Medication Sig  . aspirin EC 81 MG tablet Take 81 mg by mouth daily.  . fenofibrate (TRICOR) 145 MG tablet Take 145 mg by mouth daily.  . metoprolol succinate (TOPROL-XL) 50 MG 24 hr tablet Take 50 mg by mouth daily. Take with or immediately following a meal.  . pantoprazole (PROTONIX) 40 MG tablet Take 40 mg by mouth daily.  . nitroGLYCERIN (NITROSTAT) 0.4 MG SL tablet Place 0.4 mg under the tongue every 5 (five) minutes as needed for chest pain.   No facility-administered encounter medications on file as of 01/28/2018.     PAST MEDICAL HISTORY:   Past Medical History:  Diagnosis Date  . Breast cancer ([place])   . CAD (coronary artery disease)   . Cervical dystonia   . COPD (chronic obstructive pulmonary disease) ([place])   . Depression   . GERD (gastroesophageal reflux  disease)   . Goiter   . Hiatal hernia   . HTN (hypertension)   . Hypercholesteremia   . Lung metastases ([place]) 1992  . PUD (peptic ulcer disease)   . Tremor     PAST SURGICAL HISTORY:   Past Surgical History:  Procedure Laterality Date  . ANGIOPLASTY  07/2001  . APPENDECTOMY    . CATARACT EXTRACTION  12/2014  . LUNG REMOVAL, PARTIAL  1997  . MASTECTOMY Right 1996  . ROTATOR CUFF REPAIR Right 2003  . TONSILLECTOMY      SOCIAL HISTORY:   Social History   Socioeconomic History  . Marital status: Single    Spouse name: Not on file  . Number of children: Not on file  . Years of education: Not on file  . Highest education level: Not on file  Occupational History  . Not on file  Social Needs  . Financial resource strain: Not on file  . Food insecurity:    Worry: Not on file     Inability: Not on file  . Transportation needs:    Medical: Not on file    Non-medical: Not on file  Tobacco Use  . Smoking status: Current Every Day Smoker  . Smokeless tobacco: Never Used  . Tobacco comment: 5 cigarettes per day  Substance and Sexual Activity  . Alcohol use: No    Frequency: Never  . Drug use: No  . Sexual activity: Not on file  Lifestyle  . Physical activity:    Days per week: Not on file    Minutes per session: Not on file  . Stress: Not on file  Relationships  . Social connections:    Talks on phone: Not on file    Gets together: Not on file    Attends religious service: Not on file    Active member of club or organization: Not on file    Attends meetings of clubs or organizations: Not on file    Relationship status: Not on file  . Intimate partner violence:    Fear of current or ex partner: Not on file    Emotionally abused: Not on file    Physically abused: Not on file    Forced sexual activity: Not on file  Other Topics Concern  . Not on file  Social History Narrative  . Not on file    FAMILY HISTORY:   Family Status  Relation Name Status  . Mother  Deceased  . Father  Deceased  . Sister  Alive  . Brother  Deceased  . Brother  Deceased  . Brother  Deceased  . Sister  Deceased  . Brother 2 Alive  . Daughter 3 Alive    ROS:  Review of Systems  Constitutional: Negative.   HENT: Negative.   Eyes: Negative.   Cardiovascular: Negative.   Gastrointestinal: Negative.   Genitourinary: Negative.   Musculoskeletal: Positive for neck pain (mild).  Skin: Negative.      PHYSICAL EXAMINATION:    VITALS:   Vitals:   01/28/18 1130  BP: (!) 138/94  Pulse: 72  SpO2: 98%  Weight: 126 lb (57.2 kg)  Height: 5\' 3"  (1.6 m)     Gen:  Appears stated age and in NAD. HEENT:  Normocephalic, atraumatic. The mucous membranes are moist. The superficial temporal arteries are without ropiness or tenderness. Cardiovascular: Regular rate and  rhythm. Lungs: Clear to auscultation bilaterally. Neck: There are no carotid bruits noted bilaterally.  Head/neck is held midline.  There  is very little head tremor today.  NEUROLOGICAL:  Orientation:  The patient is alert and oriented x 3.   Cranial nerves: There is good facial symmetry. Speech is fluent and clear. Soft palate rises symmetrically and there is no tongue deviation. Hearing is intact to conversational tone. Tone: Tone is good throughout. Sensation: Sensation is intact to light touch  Coordination:  The patient has no difficulty with RAM's or FNF bilaterally. Motor: Strength is 5/5 in the bilateral upper and lower extremities.  Shoulder shrug is equal bilaterally.  There is no pronator drift.  There are no fasciculations noted. Gait and Station: The patient is able to ambulate without difficulty.     IMPRESSION/PLAN  1. Cervical Dystonia  -Patient is markedly improved after Botox.  She had some pain in the trapezius after Botox.  I am not quite sure why this was, but this went away.  She had no head drop.  She would like to continue with Botox.  2.  Tobacco abuse  -Currently smoking a few cigarettes per day at max.  - Patient was informed of the dangers of tobacco abuse including stroke, cancer, and MI, as well as benefits of tobacco cessation.  - Patient is willing to quit, but is having trouble.  She reports that she has cut down markedly.    3.  Follow-up at Botox injection.  Cc:  [name], [name], MD

## 2018-01-28 ENCOUNTER — Ambulatory Visit (INDEPENDENT_AMBULATORY_CARE_PROVIDER_SITE_OTHER): Payer: Medicare Other | Admitting: Neurology

## 2018-01-28 ENCOUNTER — Encounter: Payer: Self-pay | Admitting: Neurology

## 2018-01-28 VITALS — BP 138/94 | HR 72 | Ht 63.0 in | Wt 126.0 lb

## 2018-01-28 DIAGNOSIS — G243 Spasmodic torticollis: Secondary | ICD-10-CM | POA: Diagnosis not present

## 2018-03-13 ENCOUNTER — Ambulatory Visit: Payer: Medicare Other | Admitting: Neurology

## 2018-04-09 ENCOUNTER — Ambulatory Visit (INDEPENDENT_AMBULATORY_CARE_PROVIDER_SITE_OTHER): Payer: Medicare Other | Admitting: Neurology

## 2018-04-09 DIAGNOSIS — G243 Spasmodic torticollis: Secondary | ICD-10-CM | POA: Diagnosis not present

## 2018-04-09 MED ORDER — ONABOTULINUMTOXINA 100 UNITS IJ SOLR
200.0000 [IU] | Freq: Once | INTRAMUSCULAR | Status: AC
  Administered 2018-04-09: 200 [IU] via INTRAMUSCULAR

## 2018-04-09 NOTE — Procedures (Signed)
[facility]   Procedure Note Botox  Attending: Dr. Jacque Byron  Preoperative Diagnosis(es): Cervical Dystonia  Result History  Onset of effect: 1 week Duration of Benefit: just started wearing off 1 week and half ago.  Pleased with efficacy Adverse Effects: n/a    Consent obtained from: The patient Benefits discussed included, but were not limited to decreased muscle tightness, increased joint range of motion, and decreased pain.  Risk discussed included, but were not limited pain and discomfort, bleeding, bruising, excessive weakness, venous thrombosis, muscle atrophy and dysphagia.  A copy of the patient medication guide was given to the patient which explains the blackbox warning.  Patients identity and treatment sites confirmed Yes.  .  Details of Procedure: Skin was cleaned with alcohol.  A 30 gauge, 25mm  needle was introduced to the target muscle, except for posterior splenius where 27 gauge, 1.5 inch needle used.   Prior to injection, the needle plunger was aspirated to make sure the needle was not within a blood vessel.  There was no blood retrieved on aspiration.    Following is a summary of the muscles injected  And the amount of Botulinum toxin used:   Dilution 0.9% preservative free saline mixed with 100 u Botox type A to make 10 U per 0.1cc  Injections  Location Left  Right Units Number of sites        Sternocleidomastoid  50 50 1  Splenius Capitus, posterior approach 70  70 1  Splenius Capitus, lateral approach 30 0 30 1  Levator Scapulae      Trapezius 15 15 30 1 each  Rhomboid major  20 20 1  TOTAL UNITS:   200    Agent: Botulinum Type A ( Onobotulinum Toxin type A ).  2 vials of Botox were used, each containing 100 units and freshly diluted with 1 mL of sterile, non-preserved saline   Total injected (Units): 200  Total wasted (Units): none wasted   Pt tolerated procedure well without complications.   Reinjection is anticipated in 3 months. 

## 2018-04-21 DIAGNOSIS — K219 Gastro-esophageal reflux disease without esophagitis: Secondary | ICD-10-CM | POA: Diagnosis not present

## 2018-04-21 DIAGNOSIS — I10 Essential (primary) hypertension: Secondary | ICD-10-CM | POA: Diagnosis not present

## 2018-04-21 DIAGNOSIS — E7800 Pure hypercholesterolemia, unspecified: Secondary | ICD-10-CM | POA: Diagnosis not present

## 2018-04-21 DIAGNOSIS — R05 Cough: Secondary | ICD-10-CM | POA: Diagnosis not present

## 2018-06-25 ENCOUNTER — Other Ambulatory Visit: Payer: Self-pay | Admitting: Family Medicine

## 2018-06-25 DIAGNOSIS — Z1231 Encounter for screening mammogram for malignant neoplasm of breast: Secondary | ICD-10-CM

## 2018-07-10 ENCOUNTER — Ambulatory Visit (INDEPENDENT_AMBULATORY_CARE_PROVIDER_SITE_OTHER): Payer: Medicare Other | Admitting: Neurology

## 2018-07-10 DIAGNOSIS — G243 Spasmodic torticollis: Secondary | ICD-10-CM

## 2018-07-10 MED ORDER — ONABOTULINUMTOXINA 100 UNITS IJ SOLR
200.0000 [IU] | Freq: Once | INTRAMUSCULAR | Status: AC
  Administered 2018-07-10: 200 [IU] via INTRAMUSCULAR

## 2018-07-10 NOTE — Procedures (Signed)
[facility]   Procedure Note Botox  Attending: Dr. Wells Guiles Tamerra Merkley  Preoperative Diagnosis(es): Cervical Dystonia  Result History  Onset of effect: 1 week Duration of Benefit: just started wearing off 1 week and half ago.  Pleased with efficacy Adverse Effects: n/a    Consent obtained from: The patient Benefits discussed included, but were not limited to decreased muscle tightness, increased joint range of motion, and decreased pain.  Risk discussed included, but were not limited pain and discomfort, bleeding, bruising, excessive weakness, venous thrombosis, muscle atrophy and dysphagia.  A copy of the patient medication guide was given to the patient which explains the blackbox warning.  Patients identity and treatment sites confirmed Yes.  .  Details of Procedure: Skin was cleaned with alcohol.  A 30 gauge, 45mm  needle was introduced to the target muscle, except for posterior splenius where 27 gauge, 1.5 inch needle used.   Prior to injection, the needle plunger was aspirated to make sure the needle was not within a blood vessel.  There was no blood retrieved on aspiration.    Following is a summary of the muscles injected  And the amount of Botulinum toxin used:   Dilution 0.9% preservative free saline mixed with 100 u Botox type A to make 10 U per 0.1cc  Injections  Location Left  Right Units Number of sites        Sternocleidomastoid  50 50 1  Splenius Capitus, posterior approach 70  70 1  Splenius Capitus, lateral approach 30 0 30 1  Levator Scapulae      Trapezius 15 15 30 1  each  Rhomboid major  20 20 1   TOTAL UNITS:   200    Agent: Botulinum Type A ( Onobotulinum Toxin type A ).  2 vials of Botox were used, each containing 100 units and freshly diluted with 1 mL of sterile, non-preserved saline   Total injected (Units): 200  Total wasted (Units): none wasted   Pt tolerated procedure well without complications.   Reinjection is anticipated in 3 months.

## 2018-08-02 DIAGNOSIS — W19XXXA Unspecified fall, initial encounter: Secondary | ICD-10-CM | POA: Diagnosis not present

## 2018-08-02 DIAGNOSIS — R5381 Other malaise: Secondary | ICD-10-CM | POA: Diagnosis not present

## 2018-08-02 DIAGNOSIS — H8110 Benign paroxysmal vertigo, unspecified ear: Secondary | ICD-10-CM | POA: Diagnosis not present

## 2018-08-02 DIAGNOSIS — I951 Orthostatic hypotension: Secondary | ICD-10-CM | POA: Diagnosis not present

## 2018-08-02 DIAGNOSIS — F17210 Nicotine dependence, cigarettes, uncomplicated: Secondary | ICD-10-CM | POA: Diagnosis not present

## 2018-08-02 DIAGNOSIS — Z853 Personal history of malignant neoplasm of breast: Secondary | ICD-10-CM | POA: Diagnosis not present

## 2018-08-02 DIAGNOSIS — R9082 White matter disease, unspecified: Secondary | ICD-10-CM | POA: Diagnosis not present

## 2018-08-02 DIAGNOSIS — K219 Gastro-esophageal reflux disease without esophagitis: Secondary | ICD-10-CM | POA: Diagnosis not present

## 2018-08-02 DIAGNOSIS — R42 Dizziness and giddiness: Secondary | ICD-10-CM | POA: Diagnosis not present

## 2018-08-02 DIAGNOSIS — I10 Essential (primary) hypertension: Secondary | ICD-10-CM | POA: Diagnosis not present

## 2018-08-02 DIAGNOSIS — S0003XA Contusion of scalp, initial encounter: Secondary | ICD-10-CM | POA: Diagnosis not present

## 2018-08-02 DIAGNOSIS — Z7982 Long term (current) use of aspirin: Secondary | ICD-10-CM | POA: Diagnosis not present

## 2018-08-02 DIAGNOSIS — E785 Hyperlipidemia, unspecified: Secondary | ICD-10-CM | POA: Diagnosis not present

## 2018-08-02 DIAGNOSIS — R296 Repeated falls: Secondary | ICD-10-CM | POA: Diagnosis not present

## 2018-08-02 DIAGNOSIS — S0990XA Unspecified injury of head, initial encounter: Secondary | ICD-10-CM | POA: Diagnosis not present

## 2018-08-02 DIAGNOSIS — M436 Torticollis: Secondary | ICD-10-CM | POA: Diagnosis not present

## 2018-08-02 DIAGNOSIS — Z79899 Other long term (current) drug therapy: Secondary | ICD-10-CM | POA: Diagnosis not present

## 2018-08-02 DIAGNOSIS — G939 Disorder of brain, unspecified: Secondary | ICD-10-CM | POA: Diagnosis not present

## 2018-08-06 DIAGNOSIS — H8112 Benign paroxysmal vertigo, left ear: Secondary | ICD-10-CM | POA: Diagnosis not present

## 2018-08-07 ENCOUNTER — Ambulatory Visit: Payer: Medicare Other

## 2018-08-07 DIAGNOSIS — H8112 Benign paroxysmal vertigo, left ear: Secondary | ICD-10-CM | POA: Diagnosis not present

## 2018-08-10 DIAGNOSIS — H8112 Benign paroxysmal vertigo, left ear: Secondary | ICD-10-CM | POA: Diagnosis not present

## 2018-08-13 DIAGNOSIS — H8112 Benign paroxysmal vertigo, left ear: Secondary | ICD-10-CM | POA: Diagnosis not present

## 2018-08-14 DIAGNOSIS — H8112 Benign paroxysmal vertigo, left ear: Secondary | ICD-10-CM | POA: Diagnosis not present

## 2018-08-17 DIAGNOSIS — H8112 Benign paroxysmal vertigo, left ear: Secondary | ICD-10-CM | POA: Diagnosis not present

## 2018-09-27 DIAGNOSIS — I10 Essential (primary) hypertension: Secondary | ICD-10-CM | POA: Diagnosis not present

## 2018-09-27 DIAGNOSIS — L03115 Cellulitis of right lower limb: Secondary | ICD-10-CM | POA: Diagnosis not present

## 2018-09-27 DIAGNOSIS — F17210 Nicotine dependence, cigarettes, uncomplicated: Secondary | ICD-10-CM | POA: Diagnosis not present

## 2018-10-14 ENCOUNTER — Encounter: Payer: Self-pay | Admitting: *Deleted

## 2018-10-14 NOTE — Progress Notes (Signed)
Submitted prior auth to [place] [employer] via Fax to [phone] on 10/08/2018  Called [place] [employer] [phone] on 10/14/2018 Spoke with Jackelyn Poling. Interaction ID ref# [id number]  She has Medicare part D  and that is the Payor. Medicaid is not a secondary to Medicare part D. So the rules follow medicare as if there were no secondary unless there is no Part D.

## 2018-10-19 ENCOUNTER — Telehealth: Payer: Self-pay | Admitting: Neurology

## 2018-10-19 NOTE — Telephone Encounter (Signed)
We do not have her [place] information. If it can be put in the system from the patient I can work on it. Thanks.

## 2018-10-19 NOTE — Telephone Encounter (Signed)
Sent via covermymeds. Awaiting decision from insurance.

## 2018-10-19 NOTE — Telephone Encounter (Signed)
Patient is needing a PA on her Botox. Please call [employer] at [phone]. You will need the quantity and units when you call. She also said to call her and let her know if this can be done before her Friday appt so she can figure out if she needs to keep or cancel her appt. Thanks!

## 2018-10-19 NOTE — Telephone Encounter (Signed)
I called patient and got her [place] info. Please see system. Courtney Larson VA#:[phone]. That was all the info she had on hand also with the PA [facility] phone #. Thanks!

## 2018-10-20 NOTE — Telephone Encounter (Signed)
Approval received from [place] valid 09/19/2018-10/19/2019.

## 2018-10-20 NOTE — Telephone Encounter (Signed)
Patient made aware.

## 2018-10-23 ENCOUNTER — Telehealth: Payer: Self-pay | Admitting: Neurology

## 2018-10-23 ENCOUNTER — Ambulatory Visit (INDEPENDENT_AMBULATORY_CARE_PROVIDER_SITE_OTHER): Payer: Medicare Other | Admitting: Neurology

## 2018-10-23 DIAGNOSIS — G243 Spasmodic torticollis: Secondary | ICD-10-CM

## 2018-10-23 DIAGNOSIS — D329 Benign neoplasm of meninges, unspecified: Secondary | ICD-10-CM | POA: Diagnosis not present

## 2018-10-23 DIAGNOSIS — F0781 Postconcussional syndrome: Secondary | ICD-10-CM | POA: Diagnosis not present

## 2018-10-23 MED ORDER — ONABOTULINUMTOXINA 100 UNITS IJ SOLR
200.0000 [IU] | Freq: Once | INTRAMUSCULAR | Status: AC
  Administered 2018-10-23: 200 [IU] via INTRAMUSCULAR

## 2018-10-23 NOTE — Telephone Encounter (Signed)
Patient made aware that CD of films she left has information for hospital on it. Will request records.

## 2018-10-23 NOTE — Telephone Encounter (Signed)
Patient called regarding needing to give Courtney Larson information regarding her Hospital visit in [place]. She would like to please speak with you. Thanks

## 2018-10-23 NOTE — Addendum Note (Signed)
Addended byAnnamaria Helling on: 10/23/2018 01:46 PM   Modules accepted: Orders

## 2018-10-23 NOTE — Progress Notes (Signed)
Courtney Larson was seen today in neurologic consultation at the request of [name], [name], MD.  The consultation is for the evaluation of cervical dystonia.  Pt previously seen by Dr. [name].  I have reviewed records made available to me.  She was last seen on February 17, 2017.   I can see her Botox notes as far back as 2016, but there was a period of time where there was a lag in injections.  Her daughter reports that she may have been in [place] visiting her other daughter when the "lag" in injections is noted, but as far as she knows she has been getting injections regularly for many years.  She states that she has been receiving injections for about 18 years (16 years here and a few prior to that in [place]).  When her symptoms first started, she noted that her neck hurt and her right ear went toward the right shoulder.  She has more tremor of the head now but less pain in the neck than she did in the past.  Reports that botox injections were very painful and seemed to "settle" in her right shoulder blade and she asked Dr. [name] to start doing injections there and it seemed to help.  According to notes, she had 20 units in rhomboid major.  10/23/18 update: Patient was seen today for Botox, but when she came she had an additional issue that she wished to address.  She is accompanied by her daughter who supplements the history.  She states that she was on vacation in [place] visiting her other daughter when she sat down and hit her head hard on a table.  She did not lose consciousness.  She initially felt fine.  She woke up in the middle of the night and had extreme dizziness/vertigo.  She went to the hospital.  She had bruising down the entire right side of her face.  She had a CT of the brain.  She was told she had a concussion.  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  She did bring the CT of the brain today.  There was a well-circumscribed  lesion external to the brain parenchyma in the left frontal region.  The patient states that she was told it was incidental.  She did continue to have the dizziness from hitting her head.  She went to rehab and states that things got markedly better.  She was in therapy for about a month and then just recently returned home.  The head injury happened in early January.  She returned home in mid February.  She feels much better, although still has some dizziness.    ALLERGIES:   Allergies  Allergen Reactions  . Artane [Trihexyphenidyl]     Lip swelling  . Lopid [Gemfibrozil]     Leg cramps  . Zocor [Simvastatin]     Leg cramps    CURRENT MEDICATIONS:  Outpatient Encounter Medications as of 10/23/2018  Medication Sig  . aspirin EC 81 MG tablet Take 81 mg by mouth daily.  . fenofibrate (TRICOR) 145 MG tablet Take 145 mg by mouth daily.  . metoprolol succinate (TOPROL-XL) 50 MG 24 hr tablet Take 50 mg by mouth daily. Take with or immediately following a meal.  . nitroGLYCERIN (NITROSTAT) 0.4 MG SL tablet Place 0.4 mg under the tongue every 5 (five) minutes as needed for chest pain.  . pantoprazole (  PROTONIX) 40 MG tablet Take 40 mg by mouth daily.   No facility-administered encounter medications on file as of 10/23/2018.     PAST MEDICAL HISTORY:   Past Medical History:  Diagnosis Date  . Breast cancer ([place])   . CAD (coronary artery disease)   . Cervical dystonia   . COPD (chronic obstructive pulmonary disease) ([place])   . Depression   . GERD (gastroesophageal reflux disease)   . Goiter   . Hiatal hernia   . HTN (hypertension)   . Hypercholesteremia   . Lung metastases ([place]) 1992  . PUD (peptic ulcer disease)   . Tremor     PAST SURGICAL HISTORY:   Past Surgical History:  Procedure Laterality Date  . ANGIOPLASTY  07/2001  . APPENDECTOMY    . CATARACT EXTRACTION  12/2014  . LUNG REMOVAL, PARTIAL  1997  . MASTECTOMY Right 1996  . ROTATOR CUFF REPAIR Right 2003  .  TONSILLECTOMY      SOCIAL HISTORY:   Social History   Socioeconomic History  . Marital status: Single    Spouse name: Not on file  . Number of children: Not on file  . Years of education: Not on file  . Highest education level: Not on file  Occupational History  . Not on file  Social Needs  . Financial resource strain: Not on file  . Food insecurity:    Worry: Not on file    Inability: Not on file  . Transportation needs:    Medical: Not on file    Non-medical: Not on file  Tobacco Use  . Smoking status: Current Every Day Smoker  . Smokeless tobacco: Never Used  . Tobacco comment: 5 cigarettes per day  Substance and Sexual Activity  . Alcohol use: No    Frequency: Never  . Drug use: No  . Sexual activity: Not on file  Lifestyle  . Physical activity:    Days per week: Not on file    Minutes per session: Not on file  . Stress: Not on file  Relationships  . Social connections:    Talks on phone: Not on file    Gets together: Not on file    Attends religious service: Not on file    Active member of club or organization: Not on file    Attends meetings of clubs or organizations: Not on file    Relationship status: Not on file  . Intimate partner violence:    Fear of current or ex partner: Not on file    Emotionally abused: Not on file    Physically abused: Not on file    Forced sexual activity: Not on file  Other Topics Concern  . Not on file  Social History Narrative  . Not on file    FAMILY HISTORY:   Family Status  Relation Name Status  . Mother  Deceased  . Father  Deceased  . Sister  Alive  . Brother  Deceased  . Brother  Deceased  . Brother  Deceased  . Sister  Deceased  . Brother 2 Alive  . Daughter 3 Alive    ROS:  A complete 10 system review of systems was obtained and was unremarkable apart from what is mentioned above.  PHYSICAL EXAMINATION:    VITALS:   There were no vitals filed for this visit.  GEN:  Normal appears female in no  acute distress.  Appears stated age. HEENT:  Normocephalic, atraumatic. The mucous membranes are moist. The superficial  temporal arteries are without ropiness or tenderness. Cardiovascular: Regular rate and rhythm. Lungs: Clear to auscultation bilaterally. Neck/Heme: There are no carotid bruits noted bilaterally.  Neck is turned to the left with complex head titubation.  NEUROLOGICAL: Orientation:  The patient is alert and oriented x 3.  Fund of knowledge is appropriate.  Recent and remote memory intact.  Attention span and concentration normal.  Repeats and names without difficulty. Cranial nerves: There is good facial symmetry. The pupils are equal round and reactive to light bilaterally. Fundoscopic exam reveals clear disc margins bilaterally. Extraocular muscles are intact and visual fields are full to confrontational testing. Speech is fluent and clear. Soft palate rises symmetrically and there is no tongue deviation. Hearing is intact to conversational tone. Tone: Tone is good throughout. Sensation: Sensation is intact to light touch and pinprick throughout (facial, trunk, extremities). Vibration is intact at the bilateral big toe. There is no extinction with double simultaneous stimulation. There is no sensory dermatomal level identified. Coordination:  The patient has no difficulty with RAM's or FNF bilaterally. Motor: Strength is 5/5 in the bilateral upper and lower extremities.  Shoulder shrug is equal and symmetric. There is no pronator drift.  There are no fasciculations noted. DTR's: Deep tendon reflexes are 2/4 at the bilateral biceps, triceps, brachioradialis, patella and achilles.  Plantar responses are downgoing bilaterally. Gait and Station: The patient is able to ambulate without difficulty. The patient is able to heel toe walk without any difficulty. The patient is able to ambulate in a tandem fashion. The patient is able to stand in the Romberg position.   IMPRESSION/PLAN  1.  Cervical Dystonia  -Botox was done today and described on a separate procedural note.  2.  Postconcussive syndrome  -Patient is doing much better.  She will let me know if she thinks she needs some more physical therapy/vestibular rehab.  3.  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  Patient requests that we repeat her CT of the brain in the future, which we will do.  I will see her back in the next few months separate from her Botox visit.  She will let me know if she has any focal or lateralizing symptoms in the meantime.  Cc:  [name], [name], MD

## 2018-10-23 NOTE — Procedures (Signed)
[facility]   Procedure Note Botox  Attending: Dr. Rebecca Tat  Preoperative Diagnosis(es): Cervical Dystonia  Result History  Onset of effect: 1 week Duration of Benefit: not worn off yet Adverse Effects: n/a    Consent obtained from: The patient Benefits discussed included, but were not limited to decreased muscle tightness, increased joint range of motion, and decreased pain.  Risk discussed included, but were not limited pain and discomfort, bleeding, bruising, excessive weakness, venous thrombosis, muscle atrophy and dysphagia.  A copy of the patient medication guide was given to the patient which explains the blackbox warning.  Patients identity and treatment sites confirmed Yes.  .  Details of Procedure: Skin was cleaned with alcohol.  A 30 gauge, 25mm  needle was introduced to the target muscle, except for posterior splenius where 27 gauge, 1.5 inch needle used.   Prior to injection, the needle plunger was aspirated to make sure the needle was not within a blood vessel.  There was no blood retrieved on aspiration.    Following is a summary of the muscles injected  And the amount of Botulinum toxin used:   Dilution 0.9% preservative free saline mixed with 100 u Botox type A to make 10 U per 0.1cc  Injections  Location Left  Right Units Number of sites        Sternocleidomastoid  50 50 1  Splenius Capitus, posterior approach 70  70 1  Splenius Capitus, lateral approach 30 0 30 1  Levator Scapulae      Trapezius 15 15 30 1 each  Rhomboid major  20 20 1  TOTAL UNITS:   200    Agent: Botulinum Type A ( Onobotulinum Toxin type A ).  2 vials of Botox were used, each containing 100 units and freshly diluted with 1 mL of sterile, non-preserved saline   Total injected (Units): 200  Total wasted (Units): none wasted   Pt tolerated procedure well without complications.   Reinjection is anticipated in 3 months. 

## 2018-10-26 ENCOUNTER — Telehealth: Payer: Self-pay | Admitting: Neurology

## 2018-10-26 NOTE — Telephone Encounter (Signed)
Reviewed records from [facility].  Nothing particularly new in the records.  As patient previously noted, there was a left frontal mass, felt to be a meningioma.  Recommended that they rescan her in 3 to 6 months given her history of breast cancer.  They gave her Antivert and Valium in the hospital for her vertigo.

## 2018-10-27 DIAGNOSIS — M6798 Unspecified disorder of synovium and tendon, other site: Secondary | ICD-10-CM | POA: Diagnosis not present

## 2018-10-27 DIAGNOSIS — D485 Neoplasm of uncertain behavior of skin: Secondary | ICD-10-CM | POA: Diagnosis not present

## 2018-10-27 DIAGNOSIS — Z85828 Personal history of other malignant neoplasm of skin: Secondary | ICD-10-CM | POA: Diagnosis not present

## 2018-10-27 DIAGNOSIS — L570 Actinic keratosis: Secondary | ICD-10-CM | POA: Diagnosis not present

## 2018-11-20 ENCOUNTER — Encounter: Payer: Self-pay | Admitting: Neurology

## 2018-11-20 NOTE — Progress Notes (Signed)
Botox authorization valid through 10/19/2019. Patient can use buy and bill.

## 2018-12-07 ENCOUNTER — Telehealth: Payer: Self-pay | Admitting: Neurology

## 2018-12-07 NOTE — Telephone Encounter (Signed)
Patient called regarding her CT Scan that she had after hitting her head. She said she had brought the disk into the office to be reviewed by Dr. Carles Collet and she was wondering what Dr. Doristine Devoid thoughts were? Also, she said that she was told she had a Benign Tumor on the other side of the brain? She was asking about a re scan and to have Dr. Carles Collet look at it. Please Call. Thanks

## 2018-12-07 NOTE — Telephone Encounter (Signed)
It was likely a meningioma, as suspected.  We are only doing emergency scans right now due to the pandemic and recommendation was to repeat that in 3-6 months.  I think we can get away with repeating that later summer as long as she is clinically doing okay and no new symptoms.  Have her call me in July (does she have a f/u visit)

## 2018-12-08 NOTE — Telephone Encounter (Signed)
I spoke with patient and she does have a botox appointment in June.  I can set up her repeat scan for July and also a follow up with you.

## 2019-01-04 ENCOUNTER — Telehealth: Payer: Self-pay | Admitting: Neurology

## 2019-01-04 ENCOUNTER — Other Ambulatory Visit: Payer: Self-pay | Admitting: Neurology

## 2019-01-04 DIAGNOSIS — D329 Benign neoplasm of meninges, unspecified: Secondary | ICD-10-CM

## 2019-01-04 DIAGNOSIS — F0781 Postconcussional syndrome: Secondary | ICD-10-CM

## 2019-01-04 NOTE — Telephone Encounter (Signed)
Ordered CT-scan of head w/o contrast-- follow up----[facility]

## 2019-01-21 ENCOUNTER — Ambulatory Visit
Admission: RE | Admit: 2019-01-21 | Discharge: 2019-01-21 | Disposition: A | Discharge status: Home / Self Care | Payer: Medicare Other | Source: Ambulatory Visit | Attending: Neurology | Admitting: Neurology

## 2019-01-21 DIAGNOSIS — D329 Benign neoplasm of meninges, unspecified: Secondary | ICD-10-CM

## 2019-01-21 DIAGNOSIS — S0990XA Unspecified injury of head, initial encounter: Secondary | ICD-10-CM | POA: Diagnosis not present

## 2019-01-21 DIAGNOSIS — F0781 Postconcussional syndrome: Secondary | ICD-10-CM

## 2019-01-22 ENCOUNTER — Telehealth: Payer: Self-pay | Admitting: Neurology

## 2019-01-22 NOTE — Telephone Encounter (Signed)
Patient is sch for a telephone visit only ( can not do a video visit ) on 01-26-19

## 2019-01-22 NOTE — Telephone Encounter (Signed)
She just has a botox appointment.  I cannot do a regular appointment that day.  Dana, schedule evisit.

## 2019-01-22 NOTE — Telephone Encounter (Signed)
Let pt know that I reviewed CT of brain that we did to evaluate her probable meningioma.  I am going to recommend (as recommended by radiologist) doing MRI brain with and without gad to get a better look at brain/lesion(s).  Make sure she doesn't have a pacemaker and isn't claustrophobic.  If agreeable, please order.

## 2019-01-22 NOTE — Telephone Encounter (Signed)
Called spoke with patient she is aware of provider response she states that she can not do a MRI due to the staples in her breast. She said that she was told by several other provider that  MRI is not recommended because of this.   She has appt with you on June 5th she states that she can discuss with you then.  She would like to know what else can be done other then the MRI  Ok to call back at [phone]

## 2019-01-25 NOTE — Progress Notes (Signed)
    Virtual Visit via Telephone Note The purpose of this virtual visit is to provide medical care while limiting exposure to the novel coronavirus.    Consent was obtained for phone visit:  Yes.   Answered questions that patient had about telehealth interaction:  Yes.   I discussed the limitations, risks, security and privacy concerns of performing an evaluation and management service by telephone. I also discussed with the patient that there may be a patient responsible charge related to this service. The patient expressed understanding and agreed to proceed.  Pt location: Home Physician Location: office Name of referring provider:  [name], [name], MD I connected with .Sonda Rumble at patients initiation/request on 01/26/2019 at  3:00 PM EDT by telephone and verified that I am speaking with the correct person using two identifiers.  Pt MRN:  [id number] Pt DOB:  1941-03-05   History of Present Illness:  Patient is seen today in follow-up for her history of cervical dystonia as well as meningioma/abnormal MRI of the brain.  Last Botox for dystonia was on October 23, 2018.  Reports that "it is working great."  She had a repeat CT of the brain on Jan 21, 2019 without contrast.  This demonstrated a 2.1 cm left frontal probable meningioma.  There is also a possible smaller 4 mm left anterior extra-axial parasagittal mass.  They suggested MRI for more complete evaluation, but the patient reports she cannot have an MRI because of staples in the breast that are not compatible with MR (done 23 years ago)   Observations/Objective:   Vitals:   01/26/19 1425  Weight: 117 lb (53.1 kg)  Height: 5\' 2"  (1.575 m)     Assessment and Plan:   1.  Cervical dystonia  -We will continue with Botox.  She is doing well with this.  2.  Meningioma  -She has a 2.1 cm left frontal meningioma.  This was previously reported in an outside hospital from a scan in December, 2019.  However, her most recent  examination suggested a possible smaller 4 mm left anterior parasagittal extra-axial nodule.  This was not reported on her previous examination.  It was suggested that we do an MRI, but patient reports that she cannot have an MRI because of staples in the breast.  We will go ahead and do a contrasted CT brain.  Pt is asymptommatic.  Follow Up Instructions:    -I discussed the assessment and treatment plan with the patient. The patient was provided an opportunity to ask questions and all were answered. The patient agreed with the plan and demonstrated an understanding of the instructions.   The patient was advised to call back or seek an in-person evaluation if the symptoms worsen or if the condition fails to improve as anticipated.    Total Time spent in visit with the patient was:  12 min, of which 100% of the time was spent in counseling, as above.   Pt understands and agrees with the plan of care outlined.     Alonza Bogus, DO

## 2019-01-26 ENCOUNTER — Telehealth: Payer: Self-pay

## 2019-01-26 ENCOUNTER — Telehealth (INDEPENDENT_AMBULATORY_CARE_PROVIDER_SITE_OTHER): Payer: Medicare Other | Admitting: Neurology

## 2019-01-26 ENCOUNTER — Other Ambulatory Visit: Payer: Self-pay

## 2019-01-26 ENCOUNTER — Encounter: Payer: Self-pay | Admitting: Neurology

## 2019-01-26 DIAGNOSIS — R9402 Abnormal brain scan: Secondary | ICD-10-CM | POA: Diagnosis not present

## 2019-01-26 DIAGNOSIS — D329 Benign neoplasm of meninges, unspecified: Secondary | ICD-10-CM

## 2019-01-26 NOTE — Telephone Encounter (Signed)
-----   Message from [place], DO sent at 01/26/2019  3:21 PM EDT ----- Pt needs CT brain WITH contrast (already had done without and need a better look).  Dx:  meningioma

## 2019-01-26 NOTE — Telephone Encounter (Signed)
Order placed

## 2019-01-29 ENCOUNTER — Other Ambulatory Visit: Payer: Self-pay

## 2019-01-29 ENCOUNTER — Ambulatory Visit (INDEPENDENT_AMBULATORY_CARE_PROVIDER_SITE_OTHER): Payer: Medicare Other | Admitting: Neurology

## 2019-01-29 DIAGNOSIS — G243 Spasmodic torticollis: Secondary | ICD-10-CM | POA: Diagnosis not present

## 2019-01-29 MED ORDER — ONABOTULINUMTOXINA 100 UNITS IJ SOLR
200.0000 [IU] | Freq: Once | INTRAMUSCULAR | Status: AC
  Administered 2019-01-29: 200 [IU] via INTRAMUSCULAR

## 2019-01-29 NOTE — Procedures (Signed)
[facility]   Procedure Note Botox  Attending: Dr. Wells Guiles Tat  Preoperative Diagnosis(es): Cervical Dystonia  Result History  Onset of effect: 1 week Duration of Benefit: not worn off yet Adverse Effects: n/a    Consent obtained from: The patient Benefits discussed included, but were not limited to decreased muscle tightness, increased joint range of motion, and decreased pain.  Risk discussed included, but were not limited pain and discomfort, bleeding, bruising, excessive weakness, venous thrombosis, muscle atrophy and dysphagia.  A copy of the patient medication guide was given to the patient which explains the blackbox warning.  Patients identity and treatment sites confirmed Yes.  .  Details of Procedure: Skin was cleaned with alcohol.  A 30 gauge, 79mm  needle was introduced to the target muscle, except for posterior splenius where 27 gauge, 1.5 inch needle used.   Prior to injection, the needle plunger was aspirated to make sure the needle was not within a blood vessel.  There was no blood retrieved on aspiration.    Following is a summary of the muscles injected  And the amount of Botulinum toxin used:   Dilution 0.9% preservative free saline mixed with 100 u Botox type A to make 10 U per 0.1cc  Injections  Location Left  Right Units Number of sites        Sternocleidomastoid  50 50 1  Splenius Capitus, posterior approach 70  70 1  Splenius Capitus, lateral approach 30 0 30 1  Levator Scapulae      Trapezius 15 15 30 1  each  Rhomboid major  20 20 1   TOTAL UNITS:   200    Agent: Botulinum Type A ( Onobotulinum Toxin type A ).  2 vials of Botox were used, each containing 100 units and freshly diluted with 1 mL of sterile, non-preserved saline   Total injected (Units): 200  Total wasted (Units): none wasted   Pt tolerated procedure well without complications.   Reinjection is anticipated in 3 months.

## 2019-02-04 ENCOUNTER — Telehealth: Payer: Self-pay | Admitting: Neurology

## 2019-02-04 NOTE — Telephone Encounter (Signed)
Pt states that she has not received a call to schedule Ct scan yet, she is going out of town on 6/19 and would like to have this done. Pls call her.

## 2019-02-04 NOTE — Telephone Encounter (Signed)
A referral to [facility] has been placed for CT scan. They are located at [place]. Pt contact them directly by calling [phone] with any questions regarding her referral.  Will call patient with this information

## 2019-02-04 NOTE — Telephone Encounter (Signed)
Called patient she was given information to call to schedule and location of imaging

## 2019-02-05 ENCOUNTER — Telehealth: Payer: Self-pay

## 2019-02-05 DIAGNOSIS — D329 Benign neoplasm of meninges, unspecified: Secondary | ICD-10-CM

## 2019-02-05 NOTE — Telephone Encounter (Signed)
That is silly but ok

## 2019-02-05 NOTE — Telephone Encounter (Signed)
Breanna from [facility] calling in regards to patient CT w/ contrast. Per radiologist unable to do CT w/ because its more then 1 day from last CT w/o . Requesting order to be changes to CT head w/ and w/o contrast Ok for change ?  [phone]

## 2019-02-05 NOTE — Telephone Encounter (Signed)
New order placed Courtney Larson notified

## 2019-02-17 ENCOUNTER — Ambulatory Visit
Admission: RE | Admit: 2019-02-17 | Discharge: 2019-02-17 | Disposition: A | Discharge status: Home / Self Care | Payer: Medicare Other | Source: Ambulatory Visit | Attending: Neurology | Admitting: Neurology

## 2019-02-17 DIAGNOSIS — D320 Benign neoplasm of cerebral meninges: Secondary | ICD-10-CM | POA: Diagnosis not present

## 2019-02-17 DIAGNOSIS — D329 Benign neoplasm of meninges, unspecified: Secondary | ICD-10-CM

## 2019-02-17 MED ORDER — IOPAMIDOL (ISOVUE-300) INJECTION 61%
75.0000 mL | Freq: Once | INTRAVENOUS | Status: AC | PRN
  Administered 2019-02-17: 75 mL via INTRAVENOUS

## 2019-02-18 ENCOUNTER — Telehealth: Payer: Self-pay

## 2019-02-18 NOTE — Telephone Encounter (Signed)
Pt aware and understands will go with follow up scan in 9 months  Reminder set

## 2019-02-18 NOTE — Telephone Encounter (Signed)
-----   Message from [place], DO sent at 02/18/2019 12:06 PM EDT ----- Let pt know that CT just showed the meningioma.  We can just repeat/follow scan in the future, unless she would like neurosurgical opinion

## 2019-02-18 NOTE — Telephone Encounter (Signed)
Called spoke with patient she was made aware and understands.  She would like to know Dr. Carles Collet opinion If she follow with neurosurgeon or just wait and follow up on scan in future?  Please advise

## 2019-02-18 NOTE — Telephone Encounter (Signed)
I think that its probably okay to follow with scan since she has no symptoms.  If she agrees, Courtney Larson, put on a reminder for repeat scan in 9 months

## 2019-03-22 DIAGNOSIS — M79671 Pain in right foot: Secondary | ICD-10-CM | POA: Diagnosis not present

## 2019-03-22 DIAGNOSIS — M25871 Other specified joint disorders, right ankle and foot: Secondary | ICD-10-CM | POA: Diagnosis not present

## 2019-03-24 DIAGNOSIS — Z85828 Personal history of other malignant neoplasm of skin: Secondary | ICD-10-CM | POA: Diagnosis not present

## 2019-03-24 DIAGNOSIS — M7130 Other bursal cyst, unspecified site: Secondary | ICD-10-CM | POA: Diagnosis not present

## 2019-04-12 DIAGNOSIS — D329 Benign neoplasm of meninges, unspecified: Secondary | ICD-10-CM | POA: Diagnosis not present

## 2019-04-12 DIAGNOSIS — E7800 Pure hypercholesterolemia, unspecified: Secondary | ICD-10-CM | POA: Diagnosis not present

## 2019-04-12 DIAGNOSIS — I10 Essential (primary) hypertension: Secondary | ICD-10-CM | POA: Diagnosis not present

## 2019-04-16 DIAGNOSIS — I10 Essential (primary) hypertension: Secondary | ICD-10-CM | POA: Diagnosis not present

## 2019-04-16 DIAGNOSIS — E7800 Pure hypercholesterolemia, unspecified: Secondary | ICD-10-CM | POA: Diagnosis not present

## 2019-04-16 DIAGNOSIS — D329 Benign neoplasm of meninges, unspecified: Secondary | ICD-10-CM | POA: Diagnosis not present

## 2019-05-05 DIAGNOSIS — M7130 Other bursal cyst, unspecified site: Secondary | ICD-10-CM | POA: Diagnosis not present

## 2019-05-05 DIAGNOSIS — Z85828 Personal history of other malignant neoplasm of skin: Secondary | ICD-10-CM | POA: Diagnosis not present

## 2019-05-07 ENCOUNTER — Other Ambulatory Visit: Payer: Self-pay

## 2019-05-07 ENCOUNTER — Ambulatory Visit (INDEPENDENT_AMBULATORY_CARE_PROVIDER_SITE_OTHER): Payer: Medicare Other | Admitting: Neurology

## 2019-05-07 DIAGNOSIS — G243 Spasmodic torticollis: Secondary | ICD-10-CM | POA: Diagnosis not present

## 2019-05-07 MED ORDER — ONABOTULINUMTOXINA 100 UNITS IJ SOLR
200.0000 [IU] | Freq: Once | INTRAMUSCULAR | Status: AC
  Administered 2019-05-07: 200 [IU] via INTRAMUSCULAR

## 2019-05-07 NOTE — Procedures (Signed)
[facility]   Procedure Note Botox  Attending: Dr. Thamar Holik  Preoperative Diagnosis(es): Cervical Dystonia  Result History  Onset of effect: 1 week Duration of Benefit: not worn off yet Adverse Effects: n/a    Consent obtained from: The patient Benefits discussed included, but were not limited to decreased muscle tightness, increased joint range of motion, and decreased pain.  Risk discussed included, but were not limited pain and discomfort, bleeding, bruising, excessive weakness, venous thrombosis, muscle atrophy and dysphagia.  A copy of the patient medication guide was given to the patient which explains the blackbox warning.  Patients identity and treatment sites confirmed Yes.  .  Details of Procedure: Skin was cleaned with alcohol.  A 30 gauge, 25mm  needle was introduced to the target muscle, except for posterior splenius where 27 gauge, 1.5 inch needle used.   Prior to injection, the needle plunger was aspirated to make sure the needle was not within a blood vessel.  There was no blood retrieved on aspiration.    Following is a summary of the muscles injected  And the amount of Botulinum toxin used:   Dilution 0.9% preservative free saline mixed with 100 u Botox type A to make 10 U per 0.1cc  Injections  Location Left  Right Units Number of sites        Sternocleidomastoid  50 50 1  Splenius Capitus, posterior approach 70  70 1  Splenius Capitus, lateral approach 30 0 30 1  Levator Scapulae      Trapezius 15 15 30 1 each  Rhomboid major  20 20 1  TOTAL UNITS:   200    Agent: Botulinum Type A ( Onobotulinum Toxin type A ).  2 vials of Botox were used, each containing 100 units and freshly diluted with 1 mL of sterile, non-preserved saline   Total injected (Units): 200  Total wasted (Units): none wasted   Pt tolerated procedure well without complications.   Reinjection is anticipated in 3 months. 

## 2019-07-09 ENCOUNTER — Ambulatory Visit: Payer: Medicare Other | Admitting: Neurology

## 2019-07-11 DIAGNOSIS — Z20828 Contact with and (suspected) exposure to other viral communicable diseases: Secondary | ICD-10-CM | POA: Diagnosis not present

## 2019-08-06 ENCOUNTER — Ambulatory Visit: Payer: Medicare Other | Admitting: Neurology

## 2019-09-01 ENCOUNTER — Ambulatory Visit (INDEPENDENT_AMBULATORY_CARE_PROVIDER_SITE_OTHER): Payer: Medicare Other | Admitting: Neurology

## 2019-09-01 ENCOUNTER — Other Ambulatory Visit: Payer: Self-pay

## 2019-09-01 DIAGNOSIS — G243 Spasmodic torticollis: Secondary | ICD-10-CM

## 2019-09-01 MED ORDER — ONABOTULINUMTOXINA 100 UNITS IJ SOLR
200.0000 [IU] | Freq: Once | INTRAMUSCULAR | Status: AC
  Administered 2019-09-01: 13:00:00 200 [IU] via INTRAMUSCULAR

## 2019-09-01 NOTE — Procedures (Signed)
[facility]   Procedure Note Botox  Attending: Dr. Wells Guiles Denis Koppel  Preoperative Diagnosis(es): Cervical Dystonia  Result History  Onset of effect: 1 week Duration of Benefit: feels that working well.   Adverse Effects: n/a    Consent obtained from: The patient Benefits discussed included, but were not limited to decreased muscle tightness, increased joint range of motion, and decreased pain.  Risk discussed included, but were not limited pain and discomfort, bleeding, bruising, excessive weakness, venous thrombosis, muscle atrophy and dysphagia.  A copy of the patient medication guide was given to the patient which explains the blackbox warning.  Patients identity and treatment sites confirmed Yes.  .  Details of Procedure: Skin was cleaned with alcohol.  A 30 gauge, 40mm  needle was introduced to the target muscle, except for posterior splenius where 27 gauge, 1.5 inch needle used.   Prior to injection, the needle plunger was aspirated to make sure the needle was not within a blood vessel.  There was no blood retrieved on aspiration.    Following is a summary of the muscles injected  And the amount of Botulinum toxin used:   Dilution 0.9% preservative free saline mixed with 100 u Botox type A to make 10 U per 0.1cc  Injections  Location Left  Right Units Number of sites        Sternocleidomastoid  50 50 1  Splenius Capitus, posterior approach 70  70 1  Splenius Capitus, lateral approach 30 0 30 1  Levator Scapulae      Trapezius 15 15 30 1  each  Rhomboid major  20 20 1   TOTAL UNITS:   200    Agent: Botulinum Type A ( Onobotulinum Toxin type A ).  2 vials of Botox were used, each containing 100 units and freshly diluted with 1 mL of sterile, non-preserved saline   Total injected (Units): 200  Total wasted (Units): none wasted   Pt tolerated procedure well without complications.   Reinjection is anticipated in 3 months.

## 2019-09-03 ENCOUNTER — Ambulatory Visit: Payer: Medicare Other | Admitting: Neurology

## 2019-10-29 ENCOUNTER — Telehealth: Payer: Self-pay | Admitting: Neurology

## 2019-10-29 DIAGNOSIS — D329 Benign neoplasm of meninges, unspecified: Secondary | ICD-10-CM

## 2019-10-29 NOTE — Telephone Encounter (Signed)
Tee, please put in order for CT head with and without contrast at [facility]  Dx: meningioma and let pt know that you did it (she is expecting it)  Hinton Dyer, put in [place] for after (maybe 3-4 weeks from now on wed)

## 2019-10-29 NOTE — Telephone Encounter (Signed)
I called and left a message for patient to call back to schedule a virtual visit on a Wednesday. She has a Botox appt schedule already on 12-03-19 so we need to make sure the appointment is 11 days before or after Botox.

## 2019-10-29 NOTE — Telephone Encounter (Signed)
CT has been ordered. Message sent to [facility] for precert.

## 2019-10-29 NOTE — Telephone Encounter (Signed)
Thank you.  Probably the CT will be done faster and appt can be done in March

## 2019-10-29 NOTE — Addendum Note (Signed)
Addended by: Ulice Brilliant T on: 10/29/2019 04:22 PM   Modules accepted: Orders

## 2019-11-11 ENCOUNTER — Telehealth: Payer: Self-pay | Admitting: Neurology

## 2019-11-11 NOTE — Telephone Encounter (Signed)
Patient has a covid-19 vaccine scheduled at 8:30 AM tomorrow. She said her family is concerned and wants her to get the okay from Dr. Carles Collet before she get the vaccine tomorrow morning. Please call patient.

## 2019-11-12 NOTE — Telephone Encounter (Signed)
Patients states she got covid vaccine this morning. She wanted to know if she could have her botox and second v=covid vaccine on the same day. I advised her that she needed to wait at least 2 week in between. Pt voiced understanding and stated she would reschedule her botox appt.   Advised patient to call back and have scheduling move her appt back. She voiced understanding.

## 2019-11-19 ENCOUNTER — Other Ambulatory Visit: Payer: Self-pay

## 2019-11-19 ENCOUNTER — Ambulatory Visit
Admission: RE | Admit: 2019-11-19 | Discharge: 2019-11-19 | Disposition: A | Discharge status: Home / Self Care | Payer: Medicare Other | Source: Ambulatory Visit | Attending: Neurology | Admitting: Neurology

## 2019-11-19 DIAGNOSIS — D320 Benign neoplasm of cerebral meninges: Secondary | ICD-10-CM | POA: Diagnosis not present

## 2019-11-19 MED ORDER — IOPAMIDOL (ISOVUE-300) INJECTION 61%
75.0000 mL | Freq: Once | INTRAVENOUS | Status: AC | PRN
  Administered 2019-11-19: 75 mL via INTRAVENOUS

## 2019-12-03 ENCOUNTER — Ambulatory Visit: Payer: Medicare Other | Admitting: Neurology

## 2019-12-14 NOTE — Progress Notes (Signed)
   Telephone (Audio) Visit The purpose of this telephone visit is to provide medical care while limiting exposure to the novel coronavirus.    Consent was obtained for telephone visit and initiated by pt/family:  Yes.   Answered questions that patient had about telehealth interaction:  Yes.   I discussed the limitations, risks, security and privacy concerns of performing an evaluation and management service by telephone. I also discussed with the patient that there may be a patient responsible charge related to this service. The patient expressed understanding and agreed to proceed.  Pt location: Home Physician Location: office Name of referring provider:  [name], [name], MD I connected with .Courtney Larson at patients initiation/request on 12/15/2019 at 10:15 AM EDT by telephone and verified that I am speaking with the correct person using two identifiers.  Pt MRN:  [id number] Pt DOB:  02-13-1941  Assessment/Plan:   1.  Meningioma  -This has been stable.  She is asymptomatic.  We will consider repeating her CT brain 12 to 24 months.  She cannot have an MRI.  2.  Cervical dystonia  -She is doing well with Botox.  Last injections were on January 6.  She actually is past due for injections, but she had to r/s the last one and has rescheduled for next month.  Need for in person visit now:  No.  Subjective:  Patient seen today in follow-up of for her CT of the head.  This was a repeat and done on November 19, 2019.  I personally reviewed it.  It was compared to her prior CT dated February 17, 2019.  There was no significant change from prior CT.  There was 2.4 cm left frontal meningioma, without associated edema or mass-effect.  There was moderate white matter disease.  Patient also has cervical dystonia for which she does Botox.  Her last injections were on January 6.  Current Movement d/o meds: botox injection   Objective:   Vitals:   12/15/19 0804  Weight: 119 lb (54 kg)  Height: 5\' 3"   (1.6 m)     Follow Up Instructions:      -I discussed the assessment and treatment plan with the patient. The patient was provided an opportunity to ask questions and all were answered. The patient agreed with the plan and demonstrated an understanding of the instructions.   The patient was advised to call back or seek an in-person evaluation if the symptoms worsen or if the condition fails to improve as anticipated.    Total Time spent in visit with the patient was:  11 min, of which 100% of the time was spent in counseling.   Pt understands and agrees with the plan of care outlined.     Alonza Bogus, DO

## 2019-12-15 ENCOUNTER — Encounter: Payer: Self-pay | Admitting: Neurology

## 2019-12-15 ENCOUNTER — Telehealth (INDEPENDENT_AMBULATORY_CARE_PROVIDER_SITE_OTHER): Payer: Medicare Other | Admitting: Neurology

## 2019-12-15 ENCOUNTER — Other Ambulatory Visit: Payer: Self-pay

## 2019-12-15 ENCOUNTER — Telehealth: Payer: Self-pay | Admitting: Neurology

## 2019-12-15 VITALS — Ht 63.0 in | Wt 119.0 lb

## 2019-12-15 DIAGNOSIS — D329 Benign neoplasm of meninges, unspecified: Secondary | ICD-10-CM

## 2019-12-15 DIAGNOSIS — D320 Benign neoplasm of cerebral meninges: Secondary | ICD-10-CM

## 2019-12-15 DIAGNOSIS — G243 Spasmodic torticollis: Secondary | ICD-10-CM | POA: Diagnosis not present

## 2019-12-15 NOTE — Telephone Encounter (Signed)
error 

## 2019-12-20 DIAGNOSIS — I2510 Atherosclerotic heart disease of native coronary artery without angina pectoris: Secondary | ICD-10-CM | POA: Diagnosis not present

## 2019-12-20 DIAGNOSIS — I10 Essential (primary) hypertension: Secondary | ICD-10-CM | POA: Diagnosis not present

## 2019-12-20 DIAGNOSIS — E7800 Pure hypercholesterolemia, unspecified: Secondary | ICD-10-CM | POA: Diagnosis not present

## 2020-01-07 ENCOUNTER — Ambulatory Visit (INDEPENDENT_AMBULATORY_CARE_PROVIDER_SITE_OTHER): Payer: Medicare Other | Admitting: Neurology

## 2020-01-07 ENCOUNTER — Other Ambulatory Visit: Payer: Self-pay

## 2020-01-07 DIAGNOSIS — G243 Spasmodic torticollis: Secondary | ICD-10-CM

## 2020-01-07 MED ORDER — ONABOTULINUMTOXINA 100 UNITS IJ SOLR
200.0000 [IU] | Freq: Once | INTRAMUSCULAR | Status: AC
  Administered 2020-01-07: 200 [IU] via INTRAMUSCULAR

## 2020-01-07 MED ORDER — ONABOTULINUMTOXINA 100 UNITS IJ SOLR: 270.0000 [IU] | Freq: Once | INTRAMUSCULAR | Status: DC

## 2020-01-07 MED ORDER — BOTOX 100 UNITS IJ SOLR: 200.0000 [IU] | Freq: Once | INTRAMUSCULAR | 0 refills | Status: DC

## 2020-01-07 NOTE — Addendum Note (Signed)
Addended by: Ulice Brilliant T on: 01/07/2020 02:07 PM   Modules accepted: Orders

## 2020-01-07 NOTE — Procedures (Signed)
[facility]   Procedure Note Botox  Attending: Dr. Wells Guiles Denton Derks  Preoperative Diagnosis(es): Cervical Dystonia  Result History  Onset of effect: 1 week Duration of Benefit: feels that working well.   Adverse Effects: n/a    Consent obtained from: The patient Benefits discussed included, but were not limited to decreased muscle tightness, increased joint range of motion, and decreased pain.  Risk discussed included, but were not limited pain and discomfort, bleeding, bruising, excessive weakness, venous thrombosis, muscle atrophy and dysphagia.  A copy of the patient medication guide was given to the patient which explains the blackbox warning.  Patients identity and treatment sites confirmed Yes.  .  Details of Procedure: Skin was cleaned with alcohol.  A 30 gauge, 14mm  needle was introduced to the target muscle, except for posterior splenius where 27 gauge, 1.5 inch needle used.   Prior to injection, the needle plunger was aspirated to make sure the needle was not within a blood vessel.  There was no blood retrieved on aspiration.    Following is a summary of the muscles injected  And the amount of Botulinum toxin used:   Dilution 0.9% preservative free saline mixed with 100 u Botox type A to make 10 U per 0.1cc  Injections  Location Left  Right Units Number of sites        Sternocleidomastoid  50 50 1  Splenius Capitus, posterior approach 70  70 1  Splenius Capitus, lateral approach 30 0 30 1  Levator Scapulae      Trapezius 15 15 30 1  each  Rhomboid major  20 20 1   TOTAL UNITS:   200    Agent: Botulinum Type A ( Onobotulinum Toxin type A ).  2 vials of Botox were used, each containing 100 units and freshly diluted with 1 mL of sterile, non-preserved saline   Total injected (Units): 200  Total wasted (Units): none wasted   Pt tolerated procedure well without complications.   Reinjection is anticipated in 3 months.

## 2020-03-19 DIAGNOSIS — Z20828 Contact with and (suspected) exposure to other viral communicable diseases: Secondary | ICD-10-CM | POA: Diagnosis not present

## 2020-03-19 DIAGNOSIS — J309 Allergic rhinitis, unspecified: Secondary | ICD-10-CM | POA: Diagnosis not present

## 2020-03-19 DIAGNOSIS — Z20822 Contact with and (suspected) exposure to covid-19: Secondary | ICD-10-CM | POA: Diagnosis not present

## 2020-04-07 ENCOUNTER — Ambulatory Visit (INDEPENDENT_AMBULATORY_CARE_PROVIDER_SITE_OTHER): Payer: Medicare Other | Admitting: Neurology

## 2020-04-07 ENCOUNTER — Other Ambulatory Visit: Payer: Self-pay

## 2020-04-07 DIAGNOSIS — G243 Spasmodic torticollis: Secondary | ICD-10-CM

## 2020-04-07 MED ORDER — ONABOTULINUMTOXINA 100 UNITS IJ SOLR
200.0000 [IU] | Freq: Once | INTRAMUSCULAR | Status: AC
  Administered 2020-04-07: 200 [IU] via INTRAMUSCULAR

## 2020-04-07 NOTE — Procedures (Signed)
[facility]   Procedure Note Botox  Attending: Dr. Wells Guiles Issam Carlyon  Preoperative Diagnosis(es): Cervical Dystonia  Result History  Doing well without SE.  Noted that area under shoulder (rhomboid) little more painful    Consent obtained from: The patient Benefits discussed included, but were not limited to decreased muscle tightness, increased joint range of motion, and decreased pain.  Risk discussed included, but were not limited pain and discomfort, bleeding, bruising, excessive weakness, venous thrombosis, muscle atrophy and dysphagia.  A copy of the patient medication guide was given to the patient which explains the blackbox warning.  Patients identity and treatment sites confirmed Yes.  .  Details of Procedure: Skin was cleaned with alcohol.  A 30 gauge, 29mm  needle was introduced to the target muscle, except for posterior splenius where 27 gauge, 1.5 inch needle used.   Prior to injection, the needle plunger was aspirated to make sure the needle was not within a blood vessel.  There was no blood retrieved on aspiration.    Following is a summary of the muscles injected  And the amount of Botulinum toxin used:   Dilution 0.9% preservative free saline mixed with 100 u Botox type A to make 10 U per 0.1cc  Injections  Location Left  Right Units Number of sites        Sternocleidomastoid  50 50 1  Splenius Capitus, posterior approach 70  70 1  Splenius Capitus, lateral approach 30 0 30 1  Levator Scapulae      Trapezius 15 15 30 1  each  Rhomboid major  20 20 1   TOTAL UNITS:   200    Agent: Botulinum Type A ( Onobotulinum Toxin type A ).  2 vials of Botox were used, each containing 100 units and freshly diluted with 1 mL of sterile, non-preserved saline   Total injected (Units): 200  Total wasted (Units): none wasted   Pt tolerated procedure well without complications.   Reinjection is anticipated in 3 months.

## 2020-04-07 NOTE — Addendum Note (Signed)
Addended by: Ulice Brilliant T on: 04/07/2020 10:22 AM   Modules accepted: Orders

## 2020-05-29 ENCOUNTER — Encounter: Payer: Self-pay | Admitting: Neurology

## 2020-05-29 NOTE — Progress Notes (Signed)
covered buy and bill per insurer guidelines medicare

## 2020-06-07 DIAGNOSIS — C44629 Squamous cell carcinoma of skin of left upper limb, including shoulder: Secondary | ICD-10-CM | POA: Diagnosis not present

## 2020-06-07 DIAGNOSIS — L309 Dermatitis, unspecified: Secondary | ICD-10-CM | POA: Diagnosis not present

## 2020-06-07 DIAGNOSIS — Z85828 Personal history of other malignant neoplasm of skin: Secondary | ICD-10-CM | POA: Diagnosis not present

## 2020-06-07 DIAGNOSIS — L570 Actinic keratosis: Secondary | ICD-10-CM | POA: Diagnosis not present

## 2020-06-07 DIAGNOSIS — D0461 Carcinoma in situ of skin of right upper limb, including shoulder: Secondary | ICD-10-CM | POA: Diagnosis not present

## 2020-06-07 DIAGNOSIS — D485 Neoplasm of uncertain behavior of skin: Secondary | ICD-10-CM | POA: Diagnosis not present

## 2020-06-28 DIAGNOSIS — E7800 Pure hypercholesterolemia, unspecified: Secondary | ICD-10-CM | POA: Diagnosis not present

## 2020-06-28 DIAGNOSIS — I10 Essential (primary) hypertension: Secondary | ICD-10-CM | POA: Diagnosis not present

## 2020-06-28 DIAGNOSIS — M436 Torticollis: Secondary | ICD-10-CM | POA: Diagnosis not present

## 2020-06-28 DIAGNOSIS — I2510 Atherosclerotic heart disease of native coronary artery without angina pectoris: Secondary | ICD-10-CM | POA: Diagnosis not present

## 2020-06-28 DIAGNOSIS — F17200 Nicotine dependence, unspecified, uncomplicated: Secondary | ICD-10-CM | POA: Diagnosis not present

## 2020-06-28 DIAGNOSIS — R011 Cardiac murmur, unspecified: Secondary | ICD-10-CM | POA: Diagnosis not present

## 2020-07-07 ENCOUNTER — Other Ambulatory Visit: Payer: Self-pay

## 2020-07-07 ENCOUNTER — Ambulatory Visit (INDEPENDENT_AMBULATORY_CARE_PROVIDER_SITE_OTHER): Payer: Medicare Other | Admitting: Neurology

## 2020-07-07 DIAGNOSIS — G243 Spasmodic torticollis: Secondary | ICD-10-CM

## 2020-07-07 MED ORDER — ONABOTULINUMTOXINA 100 UNITS IJ SOLR
200.0000 [IU] | Freq: Once | INTRAMUSCULAR | Status: AC
  Administered 2020-07-07: 200 [IU] via INTRAMUSCULAR

## 2020-07-07 NOTE — Procedures (Signed)
[facility]   Procedure Note Botox  Attending: Dr. Wells Guiles Amara Manalang  Preoperative Diagnosis(es): Cervical Dystonia  Result History  Doing well without SE.      Consent obtained from: The patient Benefits discussed included, but were not limited to decreased muscle tightness, increased joint range of motion, and decreased pain.  Risk discussed included, but were not limited pain and discomfort, bleeding, bruising, excessive weakness, venous thrombosis, muscle atrophy and dysphagia.  A copy of the patient medication guide was given to the patient which explains the blackbox warning.  Patients identity and treatment sites confirmed Yes.  .  Details of Procedure: Skin was cleaned with alcohol.  A 30 gauge, 24mm  needle was introduced to the target muscle, except for posterior splenius where 27 gauge, 1.5 inch needle used.   Prior to injection, the needle plunger was aspirated to make sure the needle was not within a blood vessel.  There was no blood retrieved on aspiration.    Following is a summary of the muscles injected  And the amount of Botulinum toxin used:   Dilution 0.9% preservative free saline mixed with 100 u Botox type A to make 10 U per 0.1cc  Injections  Location Left  Right Units Number of sites        Sternocleidomastoid  50 50 1  Splenius Capitus, posterior approach 70  70 1  Splenius Capitus, lateral approach 30 0 30 1  Levator Scapulae      Trapezius 15 15 30 1  each  Rhomboid major  20 20 1   TOTAL UNITS:   200    Agent: Botulinum Type A ( Onobotulinum Toxin type A ).  2 vials of Botox were used, each containing 100 units and freshly diluted with 1 mL of sterile, non-preserved saline   Total injected (Units): 200  Total wasted (Units): none wasted   Pt tolerated procedure well without complications.   Reinjection is anticipated in 3 months.

## 2020-07-09 DIAGNOSIS — I2510 Atherosclerotic heart disease of native coronary artery without angina pectoris: Secondary | ICD-10-CM | POA: Insufficient documentation

## 2020-07-09 DIAGNOSIS — I10 Essential (primary) hypertension: Secondary | ICD-10-CM | POA: Insufficient documentation

## 2020-07-09 DIAGNOSIS — E785 Hyperlipidemia, unspecified: Secondary | ICD-10-CM | POA: Insufficient documentation

## 2020-07-09 NOTE — Progress Notes (Signed)
Cardiology Office Note   Date:  07/11/2020   ID:  Courtney Larson, DOB [birth year], MRN [id number]  PCP:  Aretta Nip, MD  Cardiologist:   Minus Breeding, MD Referring:  Aretta Nip, MD  Chief Complaint  Patient presents with  . Heart Murmur      History of Present Illness:   Courtney Larson is a 79 y.o. female who presents for evaluation of an abnormal EKG.  I saw her last in 2009.  She had chest pain in Dec 2002 and had stenting of an LAD.  She had a cath in 2004 and she had 30% in stent restenosis but no other obstructive disease.  She had 50% renal artery stenosis.    She was sent back because she was noted recently to have a murmur. She actually has done well all these years later and has not seen a cardiologist. She has not required any nitroglycerin. She stays very active and lives by herself. She does her household chores. She denies any chest pressure, neck or arm discomfort. She does not have any palpitations, presyncope or syncope. She denies any shortness of breath, PND or orthopnea. Has had no weight gain or edema.   Past Medical History:  Diagnosis Date  . Breast cancer ([place])   . CAD (coronary artery disease)   . Cervical dystonia   . COPD (chronic obstructive pulmonary disease) ([place])   . Depression   . GERD (gastroesophageal reflux disease)   . Goiter   . Hiatal hernia   . HTN (hypertension)   . Hypercholesteremia   . Lung metastases ([place]) 1992  . PUD (peptic ulcer disease)   . Tremor     Past Surgical History:  Procedure Laterality Date  . ANGIOPLASTY  07/2001  . APPENDECTOMY    . CATARACT EXTRACTION  12/2014  . LUNG REMOVAL, PARTIAL  1997  . MASTECTOMY Right 1996  . ROTATOR CUFF REPAIR Right 2003  . TONSILLECTOMY       Current Outpatient Medications  Medication Sig Dispense Refill  . aspirin EC 81 MG tablet Take 81 mg by mouth daily.    . fenofibrate (TRICOR) 145 MG tablet Take 145 mg by mouth daily.    . metoprolol succinate  (TOPROL-XL) 50 MG 24 hr tablet Take 50 mg by mouth daily. Take with or immediately following a meal.    . nitroGLYCERIN (NITROSTAT) 0.4 MG SL tablet Place 0.4 mg under the tongue every 5 (five) minutes as needed for chest pain.    . pantoprazole (PROTONIX) 40 MG tablet Take 40 mg by mouth daily.     No current facility-administered medications for this visit.    Allergies:   Artane [trihexyphenidyl], Lopid [gemfibrozil], Penicillins, and Zocor [simvastatin]    Social History:  The patient  reports that she has been smoking. She has never used smokeless tobacco. She reports that she does not drink alcohol and does not use drugs.   Family History:  The patient's family history includes CVA in her sister; Cancer in her father; Colon cancer in her sister; Esophageal cancer in her brother; Heart disease in her brother, brother, and sister; Prostate cancer in her brother; Transient ischemic attack in her sister.    ROS:  Please see the history of present illness.   Otherwise, review of systems are positive for none.   All other systems are reviewed and negative.    PHYSICAL EXAM: VS:  BP (!) 150/78   Pulse 69   Temp (!)  96.3 F (35.7 C)   Ht 5\' 3"  (1.6 m)   Wt 118 lb 6.4 oz (53.7 kg)   SpO2 95%   BMI 20.97 kg/m  , BMI Body mass index is 20.97 kg/m. GENERAL:  Well appearing HEENT:  Pupils equal round and reactive, fundi not visualized, oral mucosa unremarkable NECK:  No jugular venous distention, waveform within normal limits, carotid upstroke brisk and symmetric, no bruits, no thyromegaly LYMPHATICS:  No cervical, inguinal adenopathy LUNGS:  Clear to auscultation bilaterally BACK:  No CVA tenderness CHEST:  Unremarkable HEART:  PMI not displaced or sustained,S1 and S2 within normal limits, no S3, no S4, no clicks, no rubs, 2 out of 6 apical systolic murmur radiating up the aortic outflow tract, no change with Valsalva, no diastolic murmurs ABD:  Flat, positive bowel sounds normal in  frequency in pitch, no bruits, no rebound, no guarding, no midline pulsatile mass, no hepatomegaly, no splenomegaly EXT:  2 plus pulses throughout, no edema, no cyanosis no clubbing SKIN:  No rashes no nodules NEURO:  Cranial nerves II through XII grossly intact, motor grossly intact throughout PSYCH:  Cognitively intact, oriented to person place and time    EKG:  EKG is ordered today. The ekg ordered today demonstrates sinus rhythm, rate 69, axis within normal limits, intervals within normal limits, no acute ST-T wave changes.   Recent Labs: No results found for requested labs within last 8760 hours.    Lipid Panel No results found for: CHOL, TRIG, HDL, CHOLHDL, VLDL, LDLCALC, LDLDIRECT    Wt Readings from Last 3 Encounters:  07/11/20 118 lb 6.4 oz (53.7 kg)  12/15/19 119 lb (54 kg)  01/26/19 117 lb (53.1 kg)      Other studies Reviewed: Additional studies/ records that were reviewed today include: Primary care office records including labs. Review of the above records demonstrates:  Please see elsewhere in the note.    ASSESSMENT AND PLAN:  CAD: The patient has no ongoing symptoms. She will pursue aggressive risk reduction.  HTN: Her blood pressure is elevated today but is in the 130s over 50s at home routinely so no change in therapy.  DYSLIPIDEMIA: She has been intolerant of statins. She would agree to take a PCSK9 inhibitor. 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 I would not suspect hemodynamically significant stenosis. She was prefer more conservative therapy. I will follow this clinically by listening to her again in 1 year.   Current medicines are reviewed at length with the patient today.  The patient does not have concerns regarding medicines.  The following changes have been made:  no change  Labs/ tests ordered today  include:   Orders Placed This Encounter  Procedures  . EKG 12-Lead     Disposition:   FU with me in one year.     Signed, Minus Breeding, MD  07/11/2020 5:17 PM    [name]

## 2020-07-10 DIAGNOSIS — L570 Actinic keratosis: Secondary | ICD-10-CM | POA: Diagnosis not present

## 2020-07-10 DIAGNOSIS — Z85828 Personal history of other malignant neoplasm of skin: Secondary | ICD-10-CM | POA: Diagnosis not present

## 2020-07-11 ENCOUNTER — Other Ambulatory Visit: Payer: Self-pay

## 2020-07-11 ENCOUNTER — Ambulatory Visit (INDEPENDENT_AMBULATORY_CARE_PROVIDER_SITE_OTHER): Payer: Medicare Other | Admitting: Cardiology

## 2020-07-11 ENCOUNTER — Encounter: Payer: Self-pay | Admitting: Cardiology

## 2020-07-11 VITALS — BP 150/78 | HR 69 | Temp 96.3°F | Ht 63.0 in | Wt 118.4 lb

## 2020-07-11 DIAGNOSIS — E785 Hyperlipidemia, unspecified: Secondary | ICD-10-CM

## 2020-07-11 DIAGNOSIS — I2510 Atherosclerotic heart disease of native coronary artery without angina pectoris: Secondary | ICD-10-CM

## 2020-07-11 DIAGNOSIS — I10 Essential (primary) hypertension: Secondary | ICD-10-CM | POA: Diagnosis not present

## 2020-07-11 NOTE — Patient Instructions (Signed)
Medication Instructions:  No changes *If you need a refill on your cardiac medications before your next appointment, please call your pharmacy*   Lab Work: None ordered If you have labs (blood work) drawn today and your tests are completely normal, you will receive your results only by: . MyChart Message (if you have MyChart) OR . A paper copy in the mail If you have any lab test that is abnormal or we need to change your treatment, we will call you to review the results.   Testing/Procedures: None ordered   Follow-Up: At [facility], you and your health needs are our priority.  As part of our continuing mission to provide you with exceptional heart care, we have created designated Provider Care Teams.  These Care Teams include your primary Cardiologist (physician) and Advanced Practice Providers (APPs -  Physician Assistants and Nurse Practitioners) who all work together to provide you with the care you need, when you need it.  We recommend signing up for the patient portal called "MyChart".  Sign up information is provided on this After Visit Summary.  MyChart is used to connect with patients for Virtual Visits (Telemedicine).  Patients are able to view lab/test results, encounter notes, upcoming appointments, etc.  Non-urgent messages can be sent to your provider as well.   To learn more about what you can do with MyChart, go to [email or web address].    Your next appointment:   12 month(s)  The format for your next appointment:   In Person  Provider:   James Hochrein, MD   Other Instructions None   

## 2020-07-17 DIAGNOSIS — Z85828 Personal history of other malignant neoplasm of skin: Secondary | ICD-10-CM | POA: Diagnosis not present

## 2020-07-17 DIAGNOSIS — S40811A Abrasion of right upper arm, initial encounter: Secondary | ICD-10-CM | POA: Diagnosis not present

## 2020-07-17 DIAGNOSIS — S60512A Abrasion of left hand, initial encounter: Secondary | ICD-10-CM | POA: Diagnosis not present

## 2020-07-24 DIAGNOSIS — S40811D Abrasion of right upper arm, subsequent encounter: Secondary | ICD-10-CM | POA: Diagnosis not present

## 2020-07-24 DIAGNOSIS — S60512D Abrasion of left hand, subsequent encounter: Secondary | ICD-10-CM | POA: Diagnosis not present

## 2020-07-24 DIAGNOSIS — Z85828 Personal history of other malignant neoplasm of skin: Secondary | ICD-10-CM | POA: Diagnosis not present

## 2020-08-15 ENCOUNTER — Other Ambulatory Visit: Payer: Self-pay | Admitting: Family Medicine

## 2020-08-15 DIAGNOSIS — Z853 Personal history of malignant neoplasm of breast: Secondary | ICD-10-CM

## 2020-08-15 DIAGNOSIS — Z9011 Acquired absence of right breast and nipple: Secondary | ICD-10-CM

## 2020-09-22 ENCOUNTER — Ambulatory Visit
Admission: RE | Admit: 2020-09-22 | Discharge: 2020-09-22 | Disposition: A | Discharge status: Home / Self Care | Payer: Medicare Other | Source: Ambulatory Visit | Attending: Family Medicine | Admitting: Family Medicine

## 2020-09-22 ENCOUNTER — Other Ambulatory Visit: Payer: Self-pay

## 2020-09-22 DIAGNOSIS — Z853 Personal history of malignant neoplasm of breast: Secondary | ICD-10-CM

## 2020-09-22 DIAGNOSIS — Z1231 Encounter for screening mammogram for malignant neoplasm of breast: Secondary | ICD-10-CM | POA: Diagnosis not present

## 2020-09-22 DIAGNOSIS — Z9011 Acquired absence of right breast and nipple: Secondary | ICD-10-CM

## 2020-10-06 ENCOUNTER — Other Ambulatory Visit: Payer: Self-pay

## 2020-10-06 ENCOUNTER — Ambulatory Visit (INDEPENDENT_AMBULATORY_CARE_PROVIDER_SITE_OTHER): Payer: Medicare Other | Admitting: Neurology

## 2020-10-06 DIAGNOSIS — G243 Spasmodic torticollis: Secondary | ICD-10-CM | POA: Diagnosis not present

## 2020-10-06 MED ORDER — ONABOTULINUMTOXINA 100 UNITS IJ SOLR
200.0000 [IU] | Freq: Once | INTRAMUSCULAR | Status: AC
  Administered 2020-10-06: 200 [IU] via INTRAMUSCULAR

## 2020-10-06 NOTE — Procedures (Signed)
[facility]   Procedure Note Botox  Attending: Dr. Wells Guiles Courtney Larson  Preoperative Diagnosis(es): Cervical Dystonia  Result History  Some tremor but feels botox working well    Consent obtained from: The patient Benefits discussed included, but were not limited to decreased muscle tightness, increased joint range of motion, and decreased pain.  Risk discussed included, but were not limited pain and discomfort, bleeding, bruising, excessive weakness, venous thrombosis, muscle atrophy and dysphagia.  A copy of the patient medication guide was given to the patient which explains the blackbox warning.  Patients identity and treatment sites confirmed Yes.  .  Details of Procedure: Skin was cleaned with alcohol.  A 30 gauge, 73mm  needle was introduced to the target muscle, except for posterior splenius where 27 gauge, 1.5 inch needle used.   Prior to injection, the needle plunger was aspirated to make sure the needle was not within a blood vessel.  There was no blood retrieved on aspiration.    Following is a summary of the muscles injected  And the amount of Botulinum toxin used:   Dilution 0.9% preservative free saline mixed with 100 u Botox type A to make 10 U per 0.1cc  Injections  Location Left  Right Units Number of sites        Sternocleidomastoid  50 50 1  Splenius Capitus, posterior approach 70  70 1  Splenius Capitus, lateral approach 30 0 30 1  Levator Scapulae      Trapezius 15 15 30 1  each  Rhomboid major  20 20 1   TOTAL UNITS:   200    Agent: Botulinum Type A ( Onobotulinum Toxin type A ).  2 vials of Botox were used, each containing 100 units and freshly diluted with 1 mL of sterile, non-preserved saline   Total injected (Units): 200  Total wasted (Units): none wasted   Pt tolerated procedure well without complications.   Reinjection is anticipated in 3 months.

## 2020-12-18 MED ORDER — FUROSEMIDE 20 MG PO TABS: 20.0000 mg | ORAL_TABLET | ORAL | 2 refills | Status: AC | PRN

## 2021-01-05 ENCOUNTER — Other Ambulatory Visit: Payer: Self-pay

## 2021-01-05 ENCOUNTER — Ambulatory Visit (INDEPENDENT_AMBULATORY_CARE_PROVIDER_SITE_OTHER): Payer: Medicare Other | Admitting: Neurology

## 2021-01-05 DIAGNOSIS — G243 Spasmodic torticollis: Secondary | ICD-10-CM | POA: Diagnosis not present

## 2021-01-05 MED ORDER — ONABOTULINUMTOXINA 100 UNITS IJ SOLR: 100.0000 [IU] | Freq: Once | INTRAMUSCULAR | Status: DC

## 2021-01-05 MED ORDER — ONABOTULINUMTOXINA 100 UNITS IJ SOLR
200.0000 [IU] | Freq: Once | INTRAMUSCULAR | Status: AC
  Administered 2021-01-05: 200 [IU] via INTRAMUSCULAR

## 2021-01-05 NOTE — Procedures (Signed)
[facility]   Procedure Note Botox  Attending: Dr. Wells Guiles Joslynne Klatt  Preoperative Diagnosis(es): Cervical Dystonia  Result History  Happy with efficacy    Consent obtained from: The patient Benefits discussed included, but were not limited to decreased muscle tightness, increased joint range of motion, and decreased pain.  Risk discussed included, but were not limited pain and discomfort, bleeding, bruising, excessive weakness, venous thrombosis, muscle atrophy and dysphagia.  A copy of the patient medication guide was given to the patient which explains the blackbox warning.  Patients identity and treatment sites confirmed Yes.  .  Details of Procedure: Skin was cleaned with alcohol.  A 30 gauge, 60mm  needle was introduced to the target muscle, except for posterior splenius where 27 gauge, 1.5 inch needle used.   Prior to injection, the needle plunger was aspirated to make sure the needle was not within a blood vessel.  There was no blood retrieved on aspiration.    Following is a summary of the muscles injected  And the amount of Botulinum toxin used:   Dilution 0.9% preservative free saline mixed with 100 u Botox type A to make 10 U per 0.1cc  Injections  Location Left  Right Units Number of sites        Sternocleidomastoid  50 50 1  Splenius Capitus, posterior approach 70  70 1  Splenius Capitus, lateral approach 30 0 30 1  Levator Scapulae      Trapezius 15 15 30 1  each  Rhomboid major  20 20 1   TOTAL UNITS:   200    Agent: Botulinum Type A ( Onobotulinum Toxin type A ).  2 vials of Botox were used, each containing 100 units and freshly diluted with 1 mL of sterile, non-preserved saline   Total injected (Units): 200  Total wasted (Units): none wasted   Pt tolerated procedure well without complications.   Reinjection is anticipated in 3 months.

## 2021-03-13 DIAGNOSIS — I10 Essential (primary) hypertension: Secondary | ICD-10-CM | POA: Diagnosis not present

## 2021-03-13 DIAGNOSIS — E7800 Pure hypercholesterolemia, unspecified: Secondary | ICD-10-CM | POA: Diagnosis not present

## 2021-03-13 DIAGNOSIS — K219 Gastro-esophageal reflux disease without esophagitis: Secondary | ICD-10-CM | POA: Diagnosis not present

## 2021-03-13 DIAGNOSIS — I2510 Atherosclerotic heart disease of native coronary artery without angina pectoris: Secondary | ICD-10-CM | POA: Diagnosis not present

## 2021-03-13 DIAGNOSIS — R634 Abnormal weight loss: Secondary | ICD-10-CM | POA: Diagnosis not present

## 2021-04-04 ENCOUNTER — Telehealth: Payer: Self-pay | Admitting: Neurology

## 2021-04-04 DIAGNOSIS — D329 Benign neoplasm of meninges, unspecified: Secondary | ICD-10-CM

## 2021-04-04 NOTE — Telephone Encounter (Signed)
Order has been placed and patient has been called and informed that her CT was sent to check on her Meningioma. Patient is aware that [facility] imaging will give her a call once PA is completed. Patient thanked [place] for the call and Thanked Dr. Carles Collet for following up on her care.

## 2021-04-04 NOTE — Telephone Encounter (Signed)
Put in order for CT brain with and without contrast.  Dx:  meningioma.  Let pt know that I re-ordered that and its time to look at it again to make sure not growing.

## 2021-04-06 ENCOUNTER — Ambulatory Visit: Payer: Medicare Other | Admitting: Neurology

## 2021-04-06 ENCOUNTER — Ambulatory Visit
Admission: RE | Admit: 2021-04-06 | Discharge: 2021-04-06 | Disposition: A | Discharge status: Home / Self Care | Payer: Medicare Other | Source: Ambulatory Visit | Attending: Family Medicine | Admitting: Family Medicine

## 2021-04-06 ENCOUNTER — Other Ambulatory Visit: Payer: Self-pay | Admitting: Family Medicine

## 2021-04-06 DIAGNOSIS — M1612 Unilateral primary osteoarthritis, left hip: Secondary | ICD-10-CM | POA: Diagnosis not present

## 2021-04-06 DIAGNOSIS — R1032 Left lower quadrant pain: Secondary | ICD-10-CM | POA: Diagnosis not present

## 2021-04-06 DIAGNOSIS — R1030 Lower abdominal pain, unspecified: Secondary | ICD-10-CM | POA: Diagnosis not present

## 2021-04-25 ENCOUNTER — Other Ambulatory Visit: Payer: Self-pay

## 2021-04-25 ENCOUNTER — Ambulatory Visit
Admission: RE | Admit: 2021-04-25 | Discharge: 2021-04-25 | Disposition: A | Discharge status: Home / Self Care | Payer: Medicare Other | Source: Ambulatory Visit | Attending: Neurology | Admitting: Neurology

## 2021-04-25 DIAGNOSIS — Z85118 Personal history of other malignant neoplasm of bronchus and lung: Secondary | ICD-10-CM | POA: Diagnosis not present

## 2021-04-25 DIAGNOSIS — D329 Benign neoplasm of meninges, unspecified: Secondary | ICD-10-CM

## 2021-04-25 DIAGNOSIS — D320 Benign neoplasm of cerebral meninges: Secondary | ICD-10-CM | POA: Diagnosis not present

## 2021-04-25 DIAGNOSIS — Z853 Personal history of malignant neoplasm of breast: Secondary | ICD-10-CM | POA: Diagnosis not present

## 2021-04-25 DIAGNOSIS — I2510 Atherosclerotic heart disease of native coronary artery without angina pectoris: Secondary | ICD-10-CM | POA: Diagnosis not present

## 2021-04-25 MED ORDER — IOPAMIDOL (ISOVUE-300) INJECTION 61%
75.0000 mL | Freq: Once | INTRAVENOUS | Status: AC | PRN
  Administered 2021-04-25: 75 mL via INTRAVENOUS

## 2021-06-07 ENCOUNTER — Telehealth: Payer: Self-pay

## 2021-06-07 NOTE — Telephone Encounter (Signed)
New message  Your PA has been faxed to the plan as a paper copy. Please contact the plan directly if you haven't received a determination in a typical timeframe.  You will be notified of the determination via fax.  Courtney Larson (Key: [id number])Need help? Call us at [phone] Status Sent to [employer] The plan will fax you a determination, typically within 1 to 5 business days.  How do I follow up? Drug Botox 200UNIT solution Form [employer] Special[employer] B Botulinum Toxins Injectable Medication Precertification Form Precertification Request for Botulinum Toxins Injectable Medication for Medicare Part B members. [phone]

## 2021-06-15 ENCOUNTER — Ambulatory Visit (INDEPENDENT_AMBULATORY_CARE_PROVIDER_SITE_OTHER): Payer: Medicare Other | Admitting: Neurology

## 2021-06-15 ENCOUNTER — Other Ambulatory Visit: Payer: Self-pay

## 2021-06-15 DIAGNOSIS — G243 Spasmodic torticollis: Secondary | ICD-10-CM

## 2021-06-15 MED ORDER — ONABOTULINUMTOXINA 100 UNITS IJ SOLR
200.0000 [IU] | Freq: Once | INTRAMUSCULAR | Status: AC
  Administered 2021-06-15: 200 [IU] via INTRAMUSCULAR

## 2021-06-15 NOTE — Procedures (Signed)
[facility] Clinic   Procedure Note Botox  Attending: Dr. Wells Guiles Lelan Cush  Preoperative Diagnosis(es): Cervical Dystonia  Result History  Happy with efficacy but has worn off due to long time since last botox    Consent obtained from: The patient Benefits discussed included, but were not limited to decreased muscle tightness, increased joint range of motion, and decreased pain.  Risk discussed included, but were not limited pain and discomfort, bleeding, bruising, excessive weakness, venous thrombosis, muscle atrophy and dysphagia.  A copy of the patient medication guide was given to the patient which explains the blackbox warning.  Patients identity and treatment sites confirmed Yes.  .  Details of Procedure: Skin was cleaned with alcohol.  A 30 gauge, 97mm  needle was introduced to the target muscle, except for posterior splenius where 27 gauge, 1.5 inch needle used.   Prior to injection, the needle plunger was aspirated to make sure the needle was not within a blood vessel.  There was no blood retrieved on aspiration.    Following is a summary of the muscles injected  And the amount of Botulinum toxin used:   Dilution 0.9% preservative free saline mixed with 100 u Botox type A to make 10 U per 0.1cc  Injections  Location Left  Right Units Number of sites        Sternocleidomastoid  50 50 1  Splenius Capitus, posterior approach 70  70 1  Splenius Capitus, lateral approach 30 0 30 1  Levator Scapulae      Trapezius 15 15 30 1  each  Rhomboid major  20 20 1   TOTAL UNITS:   200    Agent: Botulinum Type A ( Onobotulinum Toxin type A ).  2 vials of Botox were used, each containing 100 units and freshly diluted with 1 mL of sterile, non-preserved saline   Total injected (Units): 200  Total wasted (Units): none wasted   Pt tolerated procedure well without complications.   Reinjection is anticipated in 3 months.

## 2021-06-27 NOTE — Telephone Encounter (Signed)
F/u   Benefit Verification [id number] Submitted! For BV Basic submissions, please allow 24 hours for results.  For BV Full submissions, please allow 24-48 hours for results.

## 2021-07-27 ENCOUNTER — Other Ambulatory Visit: Payer: Self-pay | Admitting: Family Medicine

## 2021-07-27 DIAGNOSIS — Z1231 Encounter for screening mammogram for malignant neoplasm of breast: Secondary | ICD-10-CM

## 2021-08-23 NOTE — Telephone Encounter (Signed)
F/u - Resubmit   Fax sent to the plan Your PA has been faxed to the plan as a paper copy. Please contact the plan directly if you haven't received a determination in a typical timeframe.  You will be notified of the determination via fax.   Sonda Rumble (Key: [id number])Need help? Call us at [phone] Status Sent to [employer] The plan will fax you a determination, typically within 1 to 5 business days.  How do I follow up? Drug Botox 200UNIT solution Form [employer] Specialty [employer] Part B Botulinum Toxins Injectable Medication Precertification Form Precertification Request for Botulinum Toxins Injectable Medication for Medicare Part B members. [phone]) [phone] [phone]

## 2021-08-31 NOTE — Telephone Encounter (Signed)
F/u  Resubmit to [facility] Verification Submitted  Benefit Verification [id number] Submitted! For BV Basic submissions, please allow 24 hours for results.  For BV Full submissions, please allow 24-48 hours for results.

## 2021-09-14 ENCOUNTER — Other Ambulatory Visit: Payer: Self-pay

## 2021-09-14 ENCOUNTER — Ambulatory Visit (INDEPENDENT_AMBULATORY_CARE_PROVIDER_SITE_OTHER): Payer: Medicare Other | Admitting: Neurology

## 2021-09-14 DIAGNOSIS — G243 Spasmodic torticollis: Secondary | ICD-10-CM

## 2021-09-14 MED ORDER — ONABOTULINUMTOXINA 100 UNITS IJ SOLR
200.0000 [IU] | Freq: Once | INTRAMUSCULAR | Status: AC
  Administered 2021-09-14: 200 [IU] via INTRAMUSCULAR

## 2021-09-14 NOTE — Procedures (Signed)
Botulinum Clinic   Procedure Note Botox  Attending: Dr. Wells Guiles Giannis Corpuz  Preoperative Diagnosis(es): Cervical Dystonia  Result History  Wore off about a month ago.    Consent obtained from: The patient Benefits discussed included, but were not limited to decreased muscle tightness, increased joint range of motion, and decreased pain.  Risk discussed included, but were not limited pain and discomfort, bleeding, bruising, excessive weakness, venous thrombosis, muscle atrophy and dysphagia.  A copy of the patient medication guide was given to the patient which explains the blackbox warning.  Patients identity and treatment sites confirmed Yes.  .  Details of Procedure: Skin was cleaned with alcohol.  A 30 gauge, 49mm  needle was introduced to the target muscle, except for posterior splenius where 27 gauge, 1.5 inch needle used.   Prior to injection, the needle plunger was aspirated to make sure the needle was not within a blood vessel.  There was no blood retrieved on aspiration.    Following is a summary of the muscles injected  And the amount of Botulinum toxin used:   Dilution 0.9% preservative free saline mixed with 100 u Botox type A to make 10 U per 0.1cc  Injections  Location Left  Right Units Number of sites        Sternocleidomastoid  50 50 1  Splenius Capitus, posterior approach 70  70 1  Splenius Capitus, lateral approach 30 0 30 1  Levator Scapulae      Trapezius 15 15 30 1  each  Rhomboid major  20 20 1   TOTAL UNITS:   200    Agent: Botulinum Type A ( Onobotulinum Toxin type A ).  2 vials of Botox were used, each containing 100 units and freshly diluted with 1 mL of sterile, non-preserved saline   Total injected (Units): 200  Total wasted (Units): none wasted   Pt tolerated procedure well without complications.   Reinjection is anticipated in 3 months.

## 2021-09-28 ENCOUNTER — Ambulatory Visit: Payer: Medicare Other

## 2021-09-28 NOTE — Telephone Encounter (Signed)
BOTOX (onabotulinumtoxinA)  ACQUISITION - MAJOR MEDICAL BENEFITS List of Specialty Pharmacies may not include all available options. If preferred Specialty Pharmacy is not listed, check with payer.  [?]Buy and Bill [ ]  Buy and [place] Available [ ]  Specialty Pharmacy Required [ ]  Prescription Coverage Only - See Pharmacy Benefits Section [ ]  Payer will not release information Specialty Pharmacies:     COVERAGE DETAILS - MAJOR MEDICAL BENEFITS The plan's renewal date is 08/27/2003. This plan runs on a calendar year basis. Benefit Verification results were verified and confirmed for treatment in the office setting (POS 11).  If treatment will be provided in an [facility] (POS 19) setting or [facility] (POS 22) setting, a Prior Authorization may be required per guidelines provided by Centers for Medicare and Medicaid Services (CMS) effective February 24, 2019. "The local Medicare Administrative Contractor's published Local Coverage Article LCA # [id number] does not cover CPT [phone] with the diagnosis code G24.3 on the coverage policy. The local Medicare Administrative Contractor has an active LCA, but the LCA does not provide coverage information for specific procedure codes. Therefore, BOTOX ONE was unable to confirm coverage criteria for the CPT code(s) provided. Medicare typically covers on-label BOTOX treatments that are deemed medically necessary. For additional payer coverage criteria, please reference the BOTOX Policies and Forms link available at [email or web address].

## 2021-10-03 DIAGNOSIS — G243 Spasmodic torticollis: Secondary | ICD-10-CM | POA: Diagnosis not present

## 2021-10-03 DIAGNOSIS — L989 Disorder of the skin and subcutaneous tissue, unspecified: Secondary | ICD-10-CM | POA: Diagnosis not present

## 2021-10-03 DIAGNOSIS — I10 Essential (primary) hypertension: Secondary | ICD-10-CM | POA: Diagnosis not present

## 2021-10-03 DIAGNOSIS — Z0000 Encounter for general adult medical examination without abnormal findings: Secondary | ICD-10-CM | POA: Diagnosis not present

## 2021-10-03 DIAGNOSIS — R011 Cardiac murmur, unspecified: Secondary | ICD-10-CM | POA: Diagnosis not present

## 2021-10-03 DIAGNOSIS — I2510 Atherosclerotic heart disease of native coronary artery without angina pectoris: Secondary | ICD-10-CM | POA: Diagnosis not present

## 2021-10-03 DIAGNOSIS — E7800 Pure hypercholesterolemia, unspecified: Secondary | ICD-10-CM | POA: Diagnosis not present

## 2021-10-03 DIAGNOSIS — D692 Other nonthrombocytopenic purpura: Secondary | ICD-10-CM | POA: Diagnosis not present

## 2021-10-05 ENCOUNTER — Ambulatory Visit: Payer: Medicare Other

## 2021-10-11 DIAGNOSIS — Z0000 Encounter for general adult medical examination without abnormal findings: Secondary | ICD-10-CM | POA: Diagnosis not present

## 2021-10-11 DIAGNOSIS — I10 Essential (primary) hypertension: Secondary | ICD-10-CM | POA: Diagnosis not present

## 2021-10-11 DIAGNOSIS — E7800 Pure hypercholesterolemia, unspecified: Secondary | ICD-10-CM | POA: Diagnosis not present

## 2021-10-19 ENCOUNTER — Other Ambulatory Visit: Payer: Self-pay

## 2021-10-19 ENCOUNTER — Ambulatory Visit
Admission: RE | Admit: 2021-10-19 | Discharge: 2021-10-19 | Disposition: A | Discharge status: Home / Self Care | Payer: Medicare Other | Source: Ambulatory Visit | Attending: Family Medicine | Admitting: Family Medicine

## 2021-10-19 DIAGNOSIS — Z1231 Encounter for screening mammogram for malignant neoplasm of breast: Secondary | ICD-10-CM

## 2021-10-22 DIAGNOSIS — L821 Other seborrheic keratosis: Secondary | ICD-10-CM | POA: Diagnosis not present

## 2021-10-22 DIAGNOSIS — D485 Neoplasm of uncertain behavior of skin: Secondary | ICD-10-CM | POA: Diagnosis not present

## 2021-10-22 DIAGNOSIS — C44719 Basal cell carcinoma of skin of left lower limb, including hip: Secondary | ICD-10-CM | POA: Diagnosis not present

## 2021-10-22 DIAGNOSIS — D692 Other nonthrombocytopenic purpura: Secondary | ICD-10-CM | POA: Diagnosis not present

## 2021-10-22 DIAGNOSIS — D0472 Carcinoma in situ of skin of left lower limb, including hip: Secondary | ICD-10-CM | POA: Diagnosis not present

## 2021-10-22 DIAGNOSIS — Z85828 Personal history of other malignant neoplasm of skin: Secondary | ICD-10-CM | POA: Diagnosis not present

## 2021-11-16 ENCOUNTER — Emergency Department (HOSPITAL_COMMUNITY): Payer: Medicare Other

## 2021-11-16 ENCOUNTER — Inpatient Hospital Stay (HOSPITAL_COMMUNITY): Payer: Medicare Other | Admitting: Certified Registered Nurse Anesthetist

## 2021-11-16 ENCOUNTER — Inpatient Hospital Stay (HOSPITAL_COMMUNITY): Admission: EM | Disposition: E | Payer: Self-pay | Source: Home / Self Care | Attending: Internal Medicine

## 2021-11-16 ENCOUNTER — Other Ambulatory Visit (HOSPITAL_COMMUNITY): Payer: Medicare Other

## 2021-11-16 ENCOUNTER — Inpatient Hospital Stay (HOSPITAL_COMMUNITY): Payer: Medicare Other

## 2021-11-16 ENCOUNTER — Inpatient Hospital Stay (HOSPITAL_COMMUNITY)
Admission: EM | Admit: 2021-11-16 | Discharge: 2021-11-24 | DRG: 216 | Disposition: E | Payer: Medicare Other | Attending: Internal Medicine | Admitting: Internal Medicine

## 2021-11-16 ENCOUNTER — Encounter (HOSPITAL_COMMUNITY): Payer: Self-pay | Admitting: Emergency Medicine

## 2021-11-16 ENCOUNTER — Other Ambulatory Visit: Payer: Self-pay

## 2021-11-16 DIAGNOSIS — I959 Hypotension, unspecified: Secondary | ICD-10-CM | POA: Diagnosis not present

## 2021-11-16 DIAGNOSIS — I462 Cardiac arrest due to underlying cardiac condition: Secondary | ICD-10-CM | POA: Diagnosis not present

## 2021-11-16 DIAGNOSIS — Z888 Allergy status to other drugs, medicaments and biological substances status: Secondary | ICD-10-CM

## 2021-11-16 DIAGNOSIS — I10 Essential (primary) hypertension: Secondary | ICD-10-CM | POA: Diagnosis present

## 2021-11-16 DIAGNOSIS — I2121 ST elevation (STEMI) myocardial infarction involving left circumflex coronary artery: Secondary | ICD-10-CM | POA: Diagnosis not present

## 2021-11-16 DIAGNOSIS — R570 Cardiogenic shock: Secondary | ICD-10-CM | POA: Diagnosis present

## 2021-11-16 DIAGNOSIS — Z79899 Other long term (current) drug therapy: Secondary | ICD-10-CM

## 2021-11-16 DIAGNOSIS — I340 Nonrheumatic mitral (valve) insufficiency: Secondary | ICD-10-CM | POA: Diagnosis present

## 2021-11-16 DIAGNOSIS — I472 Ventricular tachycardia, unspecified: Secondary | ICD-10-CM | POA: Diagnosis not present

## 2021-11-16 DIAGNOSIS — Z7982 Long term (current) use of aspirin: Secondary | ICD-10-CM

## 2021-11-16 DIAGNOSIS — Z9011 Acquired absence of right breast and nipple: Secondary | ICD-10-CM

## 2021-11-16 DIAGNOSIS — Z880 Allergy status to penicillin: Secondary | ICD-10-CM

## 2021-11-16 DIAGNOSIS — Y831 Surgical operation with implant of artificial internal device as the cause of abnormal reaction of the patient, or of later complication, without mention of misadventure at the time of the procedure: Secondary | ICD-10-CM | POA: Diagnosis present

## 2021-11-16 DIAGNOSIS — I701 Atherosclerosis of renal artery: Secondary | ICD-10-CM | POA: Diagnosis present

## 2021-11-16 DIAGNOSIS — E7800 Pure hypercholesterolemia, unspecified: Secondary | ICD-10-CM | POA: Diagnosis present

## 2021-11-16 DIAGNOSIS — R070 Pain in throat: Secondary | ICD-10-CM | POA: Diagnosis not present

## 2021-11-16 DIAGNOSIS — M542 Cervicalgia: Secondary | ICD-10-CM | POA: Diagnosis not present

## 2021-11-16 DIAGNOSIS — R0689 Other abnormalities of breathing: Secondary | ICD-10-CM | POA: Diagnosis not present

## 2021-11-16 DIAGNOSIS — T82855A Stenosis of coronary artery stent, initial encounter: Secondary | ICD-10-CM | POA: Diagnosis present

## 2021-11-16 DIAGNOSIS — Z853 Personal history of malignant neoplasm of breast: Secondary | ICD-10-CM

## 2021-11-16 DIAGNOSIS — I2119 ST elevation (STEMI) myocardial infarction involving other coronary artery of inferior wall: Principal | ICD-10-CM

## 2021-11-16 DIAGNOSIS — I25119 Atherosclerotic heart disease of native coronary artery with unspecified angina pectoris: Secondary | ICD-10-CM | POA: Diagnosis present

## 2021-11-16 DIAGNOSIS — I2129 ST elevation (STEMI) myocardial infarction involving other sites: Secondary | ICD-10-CM | POA: Diagnosis not present

## 2021-11-16 DIAGNOSIS — K219 Gastro-esophageal reflux disease without esophagitis: Secondary | ICD-10-CM | POA: Diagnosis present

## 2021-11-16 DIAGNOSIS — I2510 Atherosclerotic heart disease of native coronary artery without angina pectoris: Secondary | ICD-10-CM | POA: Diagnosis not present

## 2021-11-16 DIAGNOSIS — I213 ST elevation (STEMI) myocardial infarction of unspecified site: Secondary | ICD-10-CM | POA: Diagnosis not present

## 2021-11-16 DIAGNOSIS — I252 Old myocardial infarction: Secondary | ICD-10-CM | POA: Diagnosis not present

## 2021-11-16 DIAGNOSIS — Z902 Acquired absence of lung [part of]: Secondary | ICD-10-CM | POA: Diagnosis not present

## 2021-11-16 DIAGNOSIS — Z20822 Contact with and (suspected) exposure to covid-19: Secondary | ICD-10-CM | POA: Diagnosis present

## 2021-11-16 DIAGNOSIS — F17210 Nicotine dependence, cigarettes, uncomplicated: Secondary | ICD-10-CM | POA: Diagnosis present

## 2021-11-16 DIAGNOSIS — J449 Chronic obstructive pulmonary disease, unspecified: Secondary | ICD-10-CM | POA: Diagnosis present

## 2021-11-16 DIAGNOSIS — R9431 Abnormal electrocardiogram [ECG] [EKG]: Secondary | ICD-10-CM | POA: Diagnosis not present

## 2021-11-16 DIAGNOSIS — R079 Chest pain, unspecified: Secondary | ICD-10-CM | POA: Diagnosis not present

## 2021-11-16 DIAGNOSIS — Z8249 Family history of ischemic heart disease and other diseases of the circulatory system: Secondary | ICD-10-CM

## 2021-11-16 DIAGNOSIS — J9600 Acute respiratory failure, unspecified whether with hypoxia or hypercapnia: Secondary | ICD-10-CM | POA: Diagnosis not present

## 2021-11-16 DIAGNOSIS — R54 Age-related physical debility: Secondary | ICD-10-CM | POA: Diagnosis present

## 2021-11-16 DIAGNOSIS — Z85118 Personal history of other malignant neoplasm of bronchus and lung: Secondary | ICD-10-CM

## 2021-11-16 HISTORY — DX: ST elevation (STEMI) myocardial infarction involving other coronary artery of inferior wall: I21.19

## 2021-11-16 HISTORY — DX: ST elevation (STEMI) myocardial infarction of unspecified site: I21.3

## 2021-11-16 HISTORY — PX: VENTRICULAR ASSIST DEVICE INSERTION: CATH118273

## 2021-11-16 HISTORY — PX: TEMPORARY PACEMAKER: CATH118268

## 2021-11-16 HISTORY — PX: LEFT HEART CATH AND CORONARY ANGIOGRAPHY: CATH118249

## 2021-11-16 HISTORY — PX: CORONARY STENT INTERVENTION: CATH118234

## 2021-11-16 HISTORY — PX: CORONARY/GRAFT ACUTE MI REVASCULARIZATION: CATH118305

## 2021-11-16 LAB — CBC WITH DIFFERENTIAL/PLATELET
Abs Immature Granulocytes: 0.05 10*3/uL (ref 0.00–0.07)
Basophils Absolute: 0.1 10*3/uL (ref 0.0–0.1)
Basophils Relative: 1 %
Eosinophils Absolute: 0 10*3/uL (ref 0.0–0.5)
Eosinophils Relative: 0 %
HCT: 46.6 % — ABNORMAL HIGH (ref 36.0–46.0)
Hemoglobin: 15 g/dL (ref 12.0–15.0)
Immature Granulocytes: 0 %
Lymphocytes Relative: 16 %
Lymphs Abs: 2 10*3/uL (ref 0.7–4.0)
MCH: 29.8 pg (ref 26.0–34.0)
MCHC: 32.2 g/dL (ref 30.0–36.0)
MCV: 92.6 fL (ref 80.0–100.0)
Monocytes Absolute: 0.4 10*3/uL (ref 0.1–1.0)
Monocytes Relative: 3 %
Neutro Abs: 9.9 10*3/uL — ABNORMAL HIGH (ref 1.7–7.7)
Neutrophils Relative %: 80 %
Platelets: 248 10*3/uL (ref 150–400)
RBC: 5.03 MIL/uL (ref 3.87–5.11)
RDW: 14.5 % (ref 11.5–15.5)
WBC: 12.4 10*3/uL — ABNORMAL HIGH (ref 4.0–10.5)
nRBC: 0 % (ref 0.0–0.2)

## 2021-11-16 LAB — COMPREHENSIVE METABOLIC PANEL
ALT: 18 U/L (ref 0–44)
AST: 65 U/L — ABNORMAL HIGH (ref 15–41)
Albumin: 4.3 g/dL (ref 3.5–5.0)
Alkaline Phosphatase: 72 U/L (ref 38–126)
Anion gap: 8 (ref 5–15)
BUN: 14 mg/dL (ref 8–23)
CO2: 27 mmol/L (ref 22–32)
Calcium: 9.6 mg/dL (ref 8.9–10.3)
Chloride: 102 mmol/L (ref 98–111)
Creatinine, Ser: 1.13 mg/dL — ABNORMAL HIGH (ref 0.44–1.00)
GFR, Estimated: 49 mL/min — ABNORMAL LOW (ref >60)
Glucose, Bld: 117 mg/dL — ABNORMAL HIGH (ref 70–99)
Potassium: 4.6 mmol/L (ref 3.5–5.1)
Sodium: 137 mmol/L (ref 135–145)
Total Bilirubin: 1 mg/dL (ref 0.3–1.2)
Total Protein: 7.5 g/dL (ref 6.5–8.1)

## 2021-11-16 LAB — LIPID PANEL
Cholesterol: 148 mg/dL (ref 0–200)
HDL: 41 mg/dL (ref >40)
LDL Cholesterol: 80 mg/dL (ref 0–99)
Total CHOL/HDL Ratio: 3.6 RATIO
Triglycerides: 136 mg/dL (ref <150)
VLDL: 27 mg/dL (ref 0–40)

## 2021-11-16 LAB — POCT I-STAT 7, (LYTES, BLD GAS, ICA,H+H)
Acid-Base Excess: 15 mmol/L — ABNORMAL HIGH (ref 0.0–2.0)
Acid-Base Excess: 16 mmol/L — ABNORMAL HIGH (ref 0.0–2.0)
Acid-base deficit: 10 mmol/L — ABNORMAL HIGH (ref 0.0–2.0)
Bicarbonate: 17.6 mmol/L — ABNORMAL LOW (ref 20.0–28.0)
Bicarbonate: 37.9 mmol/L — ABNORMAL HIGH (ref 20.0–28.0)
Bicarbonate: 37.7 mmol/L — ABNORMAL HIGH (ref 20.0–28.0)
Calcium, Ion: 0.95 mmol/L — ABNORMAL LOW (ref 1.15–1.40)
Calcium, Ion: 1.12 mmol/L — ABNORMAL LOW (ref 1.15–1.40)
Calcium, Ion: 1.44 mmol/L — ABNORMAL HIGH (ref 1.15–1.40)
HCT: 33 % — ABNORMAL LOW (ref 36.0–46.0)
HCT: 41 % (ref 36.0–46.0)
HCT: 30 % — ABNORMAL LOW (ref 36.0–46.0)
Hemoglobin: 11.2 g/dL — ABNORMAL LOW (ref 12.0–15.0)
Hemoglobin: 13.9 g/dL (ref 12.0–15.0)
Hemoglobin: 10.2 g/dL — ABNORMAL LOW (ref 12.0–15.0)
O2 Saturation: 98 %
O2 Saturation: 99 %
O2 Saturation: 100 %
Potassium: 3.3 mmol/L — ABNORMAL LOW (ref 3.5–5.1)
Potassium: 4.1 mmol/L (ref 3.5–5.1)
Potassium: 3.8 mmol/L (ref 3.5–5.1)
Sodium: 109 mmol/L — CL (ref 135–145)
Sodium: 144 mmol/L (ref 135–145)
Sodium: 151 mmol/L — ABNORMAL HIGH (ref 135–145)
TCO2: 19 mmol/L — ABNORMAL LOW (ref 22–32)
TCO2: 39 mmol/L — ABNORMAL HIGH (ref 22–32)
TCO2: 39 mmol/L — ABNORMAL HIGH (ref 22–32)
pCO2 arterial: 40.2 mmHg (ref 32–48)
pCO2 arterial: 42.8 mmHg (ref 32–48)
pCO2 arterial: 34.6 mmHg (ref 32–48)
pH, Arterial: 7.222 — ABNORMAL LOW (ref 7.35–7.45)
pH, Arterial: 7.583 — ABNORMAL HIGH (ref 7.35–7.45)
pH, Arterial: 7.646 (ref 7.35–7.45)
pO2, Arterial: 126 mmHg — ABNORMAL HIGH (ref 83–108)
pO2, Arterial: 136 mmHg — ABNORMAL HIGH (ref 83–108)
pO2, Arterial: 156 mmHg — ABNORMAL HIGH (ref 83–108)

## 2021-11-16 LAB — ECHOCARDIOGRAM LIMITED
Height: 63 in
Weight: 1888 oz

## 2021-11-16 LAB — RESP PANEL BY RT-PCR (FLU A&B, COVID) ARPGX2
Influenza A by PCR: NEGATIVE
Influenza B by PCR: NEGATIVE
SARS Coronavirus 2 by RT PCR: NEGATIVE

## 2021-11-16 LAB — PROTIME-INR
INR: 1.1 (ref 0.8–1.2)
Prothrombin Time: 14.4 seconds (ref 11.4–15.2)

## 2021-11-16 LAB — HEMOGLOBIN A1C
Hgb A1c MFr Bld: 4.9 % (ref 4.8–5.6)
Mean Plasma Glucose: 93.93 mg/dL

## 2021-11-16 LAB — POCT ACTIVATED CLOTTING TIME
Activated Clotting Time: 209 seconds
Activated Clotting Time: 504 seconds
Activated Clotting Time: >1000 seconds

## 2021-11-16 LAB — APTT: aPTT: 31 seconds (ref 24–36)

## 2021-11-16 LAB — TROPONIN I (HIGH SENSITIVITY): Troponin I (High Sensitivity): 1982 ng/L (ref <18)

## 2021-11-16 SURGERY — LEFT HEART CATH AND CORONARY ANGIOGRAPHY
Anesthesia: LOCAL

## 2021-11-16 MED ORDER — ASPIRIN 81 MG PO CHEW: 324.0000 mg | CHEWABLE_TABLET | ORAL | Status: DC

## 2021-11-16 MED ORDER — HEPARIN (PORCINE) IN NACL 1000-0.9 UT/500ML-% IV SOLN
INTRAVENOUS | Status: DC | PRN
  Administered 2021-11-16 (×2): 500 mL

## 2021-11-16 MED ORDER — EPINEPHRINE 1 MG/10ML IJ SOSY
PREFILLED_SYRINGE | INTRAMUSCULAR | Status: AC
  Filled 2021-11-16: qty 10

## 2021-11-16 MED ORDER — VERAPAMIL HCL 2.5 MG/ML IV SOLN
INTRAVENOUS | Status: DC | PRN
  Administered 2021-11-16 (×2): 10 mL via INTRA_ARTERIAL

## 2021-11-16 MED ORDER — IOHEXOL 350 MG/ML SOLN
INTRAVENOUS | Status: DC | PRN
  Administered 2021-11-16: 120 mL

## 2021-11-16 MED ORDER — CALCIUM CHLORIDE 10 % IV SOLN
INTRAVENOUS | Status: DC | PRN
  Administered 2021-11-16 (×2): 1 g via INTRAVENOUS

## 2021-11-16 MED ORDER — MIDAZOLAM-SODIUM CHLORIDE 100-0.9 MG/100ML-% IV SOLN
0.5000 mg/h | INTRAVENOUS | Status: DC
  Filled 2021-11-16: qty 100

## 2021-11-16 MED ORDER — AMIODARONE HCL IN DEXTROSE 360-4.14 MG/200ML-% IV SOLN
INTRAVENOUS | Status: DC | PRN
  Administered 2021-11-16: 59.94 mg/h via INTRAVENOUS

## 2021-11-16 MED ORDER — SODIUM BICARBONATE 8.4 % IV SOLN
INTRAVENOUS | Status: DC | PRN
  Administered 2021-11-16 (×7): 100 meq via INTRAVENOUS

## 2021-11-16 MED ORDER — HEPARIN SODIUM (PORCINE) 5000 UNIT/ML IJ SOLN
60.0000 [IU]/kg | Freq: Once | INTRAMUSCULAR | Status: AC
  Administered 2021-11-16: 3200 [IU] via INTRAVENOUS

## 2021-11-16 MED ORDER — EPINEPHRINE HCL 5 MG/250ML IV SOLN IN NS
0.5000 ug/min | INTRAVENOUS | Status: DC
  Administered 2021-11-16: 10 ug/min via INTRAVENOUS
  Filled 2021-11-16: qty 250

## 2021-11-16 MED ORDER — ALPRAZOLAM 0.25 MG PO TABS: 0.2500 mg | ORAL_TABLET | Freq: Two times a day (BID) | ORAL | Status: DC | PRN

## 2021-11-16 MED ORDER — NOREPINEPHRINE 4 MG/250ML-% IV SOLN
INTRAVENOUS | Status: AC
  Filled 2021-11-16: qty 250

## 2021-11-16 MED ORDER — FENTANYL CITRATE (PF) 100 MCG/2ML IJ SOLN
INTRAMUSCULAR | Status: AC
  Filled 2021-11-16: qty 2

## 2021-11-16 MED ORDER — SODIUM BICARBONATE 8.4 % IV SOLN
INTRAVENOUS | Status: AC
  Filled 2021-11-16: qty 100

## 2021-11-16 MED ORDER — HEPARIN SODIUM (PORCINE) 5000 UNIT/ML IJ SOLN
INTRAMUSCULAR | Status: AC
  Filled 2021-11-16: qty 1

## 2021-11-16 MED ORDER — NITROGLYCERIN 0.4 MG SL SUBL: 0.4000 mg | SUBLINGUAL_TABLET | SUBLINGUAL | Status: DC | PRN

## 2021-11-16 MED ORDER — LIDOCAINE HCL (PF) 1 % IJ SOLN
INTRAMUSCULAR | Status: DC | PRN
  Administered 2021-11-16: 2 mL
  Administered 2021-11-16: 20 mL

## 2021-11-16 MED ORDER — ACETAMINOPHEN 325 MG PO TABS: 650.0000 mg | ORAL_TABLET | ORAL | Status: DC | PRN

## 2021-11-16 MED ORDER — VASOPRESSIN 20 UNITS/100 ML INFUSION FOR SHOCK
0.0000 [IU]/min | INTRAVENOUS | Status: AC
  Administered 2021-11-16: 0.03 [IU]/min via INTRAVENOUS
  Filled 2021-11-16: qty 100

## 2021-11-16 MED ORDER — LIDOCAINE HCL (PF) 1 % IJ SOLN
INTRAMUSCULAR | Status: AC
  Filled 2021-11-16: qty 30

## 2021-11-16 MED ORDER — HEPARIN (PORCINE) IN NACL 1000-0.9 UT/500ML-% IV SOLN
INTRAVENOUS | Status: AC
  Filled 2021-11-16: qty 1000

## 2021-11-16 MED ORDER — NITROGLYCERIN 0.4 MG SL SUBL
0.4000 mg | SUBLINGUAL_TABLET | SUBLINGUAL | Status: DC | PRN
  Administered 2021-11-16: 0.4 mg via SUBLINGUAL
  Filled 2021-11-16: qty 1

## 2021-11-16 MED ORDER — ONDANSETRON HCL 4 MG/2ML IJ SOLN: 4.0000 mg | Freq: Four times a day (QID) | INTRAMUSCULAR | Status: DC | PRN

## 2021-11-16 MED ORDER — VERAPAMIL HCL 2.5 MG/ML IV SOLN
INTRAVENOUS | Status: AC
  Filled 2021-11-16: qty 2

## 2021-11-16 MED ORDER — ASPIRIN 300 MG RE SUPP: 300.0000 mg | RECTAL | Status: DC

## 2021-11-16 MED ORDER — SODIUM CHLORIDE 0.9 % IV SOLN: INTRAVENOUS | Status: DC

## 2021-11-16 MED ORDER — HEPARIN SODIUM (PORCINE) 1000 UNIT/ML IJ SOLN
INTRAMUSCULAR | Status: AC
  Filled 2021-11-16: qty 10

## 2021-11-16 MED ORDER — METOPROLOL TARTRATE 25 MG PO TABS: 25.0000 mg | ORAL_TABLET | Freq: Two times a day (BID) | ORAL | Status: DC

## 2021-11-16 MED ORDER — EPINEPHRINE 1 MG/10ML IJ SOSY
PREFILLED_SYRINGE | INTRAMUSCULAR | Status: AC
  Filled 2021-11-16: qty 20

## 2021-11-16 MED ORDER — ATORVASTATIN CALCIUM 80 MG PO TABS: 80.0000 mg | ORAL_TABLET | Freq: Every day | ORAL | Status: DC

## 2021-11-16 MED ORDER — FENTANYL 2500MCG IN NS 250ML (10MCG/ML) PREMIX INFUSION
0.0000 ug/h | INTRAVENOUS | Status: DC
  Filled 2021-11-16: qty 250

## 2021-11-16 MED ORDER — ASPIRIN EC 81 MG PO TBEC: 81.0000 mg | DELAYED_RELEASE_TABLET | Freq: Every day | ORAL | Status: DC

## 2021-11-16 MED ORDER — SODIUM BICARBONATE 8.4 % IV SOLN
INTRAVENOUS | Status: AC
  Filled 2021-11-16: qty 150

## 2021-11-16 MED ORDER — EPINEPHRINE 1 MG/10ML IJ SOSY
PREFILLED_SYRINGE | INTRAMUSCULAR | Status: DC | PRN
Start: 2021-11-16 — End: 2021-11-17
  Administered 2021-11-16 (×13): 1 mg via INTRAVENOUS

## 2021-11-16 MED ORDER — MIDAZOLAM HCL 2 MG/2ML IJ SOLN
INTRAMUSCULAR | Status: AC
  Filled 2021-11-16: qty 2

## 2021-11-16 MED ORDER — NOREPINEPHRINE BITARTRATE 1 MG/ML IV SOLN
INTRAVENOUS | Status: DC | PRN
  Administered 2021-11-16: 10 ug/min via INTRAVENOUS

## 2021-11-16 MED ORDER — FENTANYL CITRATE (PF) 100 MCG/2ML IJ SOLN
INTRAMUSCULAR | Status: DC | PRN
  Administered 2021-11-16: 25 ug via INTRAVENOUS

## 2021-11-16 MED ORDER — MIDAZOLAM HCL 2 MG/2ML IJ SOLN
INTRAMUSCULAR | Status: DC | PRN
  Administered 2021-11-16: 1 mg via INTRAVENOUS

## 2021-11-16 MED ORDER — ASPIRIN 81 MG PO CHEW: 324.0000 mg | CHEWABLE_TABLET | Freq: Once | ORAL | Status: DC

## 2021-11-16 MED ORDER — SODIUM CHLORIDE 0.9 % IV SOLN
INTRAVENOUS | Status: DC | PRN
  Administered 2021-11-16: 10 mL/h via INTRAVENOUS

## 2021-11-16 MED ORDER — HEPARIN SODIUM (PORCINE) 1000 UNIT/ML IJ SOLN
INTRAMUSCULAR | Status: DC | PRN
  Administered 2021-11-16: 4000 [IU] via INTRAVENOUS
  Administered 2021-11-16: 3000 [IU] via INTRAVENOUS

## 2021-11-16 SURGICAL SUPPLY — 31 items
BALLN SAPPHIRE 2.5X12 (BALLOONS) ×2
BALLN WOLVERINE 3.00X10 (BALLOONS) ×2
BALLOON SAPPHIRE 2.5X12 (BALLOONS) IMPLANT
BALLOON WOLVERINE 3.00X10 (BALLOONS) IMPLANT
CABLE ADAPT PACING TEMP 12FT (ADAPTER) ×1 IMPLANT
CATH DIAG 6FR JR4 (CATHETERS) ×1 IMPLANT
CATH DIAG 6FR PIGTAIL ANGLED (CATHETERS) ×1 IMPLANT
CATH INFINITI 6F FL3.5 (CATHETERS) ×1 IMPLANT
CATH LAUNCHER 6FR EBU3.5 (CATHETERS) ×1 IMPLANT
CATH S G BIP PACING (CATHETERS) ×1 IMPLANT
ELECT DEFIB PAD ADLT CADENCE (PAD) ×1 IMPLANT
GLIDESHEATH SLEND SS 6F .021 (SHEATH) ×1 IMPLANT
GUIDEWIRE INQWIRE 1.5J.035X260 (WIRE) IMPLANT
GUIDEWIRE TIGER .035X300 (WIRE) ×1 IMPLANT
GUIDEWIRE VAS SION BLUE 190 (WIRE) ×1 IMPLANT
INQWIRE 1.5J .035X260CM (WIRE) ×2
KIT ENCORE 26 ADVANTAGE (KITS) ×1 IMPLANT
KIT HEART LEFT (KITS) ×2 IMPLANT
PACK CARDIAC CATHETERIZATION (CUSTOM PROCEDURE TRAY) ×2 IMPLANT
SET IMPELLA CP PUMP (CATHETERS) ×1 IMPLANT
SHEATH PINNACLE 5F 10CM (SHEATH) ×1 IMPLANT
SHEATH PINNACLE 6F 10CM (SHEATH) ×1 IMPLANT
SHEATH PROBE COVER 6X72 (BAG) ×1 IMPLANT
SLEEVE REPOSITIONING LENGTH 30 (MISCELLANEOUS) ×1 IMPLANT
STENT ONYX FRONTIER 3.0X12 (Permanent Stent) ×1 IMPLANT
STENT ONYX FRONTIER 3.0X18 (Permanent Stent) ×1 IMPLANT
TRANSDUCER W/STOPCOCK (MISCELLANEOUS) ×2 IMPLANT
TUBING CIL FLEX 10 FLL-RA (TUBING) ×2 IMPLANT
VALVE COPILOT STAT (MISCELLANEOUS) ×1 IMPLANT
WIRE MICRO SET 5FR 12 (WIRE) ×1 IMPLANT
WIRE MICRO SET SILHO 5FR 7 (SHEATH) ×1 IMPLANT

## 2021-11-19 ENCOUNTER — Encounter (HOSPITAL_COMMUNITY): Payer: Self-pay | Admitting: Internal Medicine

## 2021-11-19 MED FILL — Verapamil HCl IV Soln 2.5 MG/ML: INTRAVENOUS | Qty: 2 | Status: AC

## 2021-11-20 MED FILL — Medication: Qty: 1 | Status: AC

## 2021-11-23 ENCOUNTER — Ambulatory Visit: Payer: Medicare Other | Admitting: Cardiology

## 2021-11-23 ENCOUNTER — Encounter (HOSPITAL_COMMUNITY): Payer: Self-pay | Admitting: Internal Medicine

## 2021-11-24 NOTE — ED Notes (Signed)
EMS at bedside for transport to [facility]  ?

## 2021-11-24 NOTE — Anesthesia Procedure Notes (Signed)
Procedure Name: Intubation ?Date/Time: Dec 15, 2021 2:25 PM ?Performed by: Valda Favia, CRNA ?Pre-anesthesia Checklist: Patient identified, Emergency Drugs available, Suction available and Patient being monitored ?Oxygen Delivery Method: Ambu bag ?Preoxygenation: Pre-oxygenation with 100% oxygen ?Ventilation: Mask ventilation without difficulty ?Laryngoscope Size: Glidescope and 3 ?Grade View: Grade I ?Tube type: Oral ?Tube size: 7.0 mm ?Number of attempts: 1 ?Airway Equipment and Method: Stylet and Video-laryngoscopy ?Placement Confirmation: ETT inserted through vocal cords under direct vision, positive ETCO2 and breath sounds checked- equal and bilateral ?Secured at: 21 cm ?Tube secured with: Tape ?Dental Injury: Teeth and Oropharynx as per pre-operative assessment  ?Comments: Placed during CPR in cath lab  ?Dr. Lissa Hoard at bedside ? ? ? ? ?

## 2021-11-24 NOTE — Progress Notes (Signed)
This chaplain responded to RN page for Pt. unexpected death. The chaplain joined the medical team and Pt. family at the bedside.  ? ?The Pt. daughters and grand-daughter shared their love for the Pt. through storytelling and grieving with each other. The chaplain is appreciative of the medical team's attention to the family.  ? ?The family accepted and joined the chaplain's invitation for prayer.  The Pt. RN-Harriet updated the family on the next steps. The chaplain escorted the Pt. family to the ED entrance. ? ?Chaplain Sallyanne Kuster ?[phone] ?

## 2021-11-24 NOTE — Death Summary Note (Signed)
Please see admission H&P for admission details.  In brief patient is an 81 year old female with history of coronary artery disease status post remote PCI who presented after developing anginal symptoms yesterday morning.  She had waxing waning symptoms since then.  She presented to [facility] long hospital with inferior posterior ST elevation myocardial infarction.  She underwent PCI of the left circumflex and balloon angioplasty of the LAD complicated by PEA and ventricular tachycardia arrest.  Despite multiple rounds of CPR, emergent Impella placement, and temporary pacemaker placement the patient remained asystolic with no electrical capture after aggressive measures for about 45 minutes.  At the this point in time further resuscitative efforts were suspended.  The patient expired on Nov 30, 2021 at 1443. ? ? ?

## 2021-11-24 NOTE — H&P (Addendum)
?Cardiology Admission History and Physical:  ? ?Patient ID: Courtney Larson ?MRN: [id number]; DOB: March 24, 1941  ? ?Admission date: 12-03-21 ? ?PCP:  Aretta Nip, MD ?  ?[facility] Providers ?Cardiologist:  Minus Breeding, MD      ? ? ?Chief Complaint:  neck pain began today at 0900 ? ?Patient Profile:  ? ?Courtney Larson is a 81 y.o. female with hx of CAD with LAD stent in 2002 and stable cath 2004, also hx 50% renal artery stenosis now admitted with STEMI who is being seen 2021-12-03 for the evaluation of STEMI. ? ?History of Present Illness:  ? ?Courtney Larson with above hx and hx breast cancer, COPD and continues to smoke 2-3 cigarettes per day, GERD, HTN, HLD and CAD.  Also hx of murmur aortic sclerosis.   ? ?Pt presented today with neck pain since 0900, EMS called her pain was present yesterday but improved until today.  EKG with ST elevation in II, III, AVF, V5-6 and Ant reciprocal changes.  In ER given 3,200 u heparin and NTG sl, brought to cath lab emergently for cath.   ? ?No hx of CVA, no GI bleeding and no recent hematuria or melena.   ? ?BP 151/76 P 68 r 29  afebrile.   ? ?Past Medical History:  ?Diagnosis Date  ? Acute ST elevation myocardial infarction (STEMI) of inferior wall ([place]) Dec 03, 2021  ? Breast cancer ([place])   ? CAD (coronary artery disease)   ? Cervical dystonia   ? COPD (chronic obstructive pulmonary disease) ([place])   ? Depression   ? GERD (gastroesophageal reflux disease)   ? Goiter   ? Hiatal hernia   ? HTN (hypertension)   ? Hypercholesteremia   ? Lung metastases ([place]) 1992  ? PUD (peptic ulcer disease)   ? STEMI (ST elevation myocardial infarction) ([place]) 03-Dec-2021  ? Tremor   ? ? ?Past Surgical History:  ?Procedure Laterality Date  ? ANGIOPLASTY  07/2001  ? APPENDECTOMY    ? CATARACT EXTRACTION  12/2014  ? LUNG REMOVAL, PARTIAL  1997  ? MASTECTOMY Right 1996  ? ROTATOR CUFF REPAIR Right 2003  ? TONSILLECTOMY    ?  ? ?Medications Prior to Admission: ?Prior to Admission medications    ?Medication Sig Start Date End Date Taking? Authorizing Provider  ?aspirin EC 81 MG tablet Take 81 mg by mouth daily.    [provider]  ?fenofibrate (TRICOR) 145 MG tablet Take 145 mg by mouth daily.    [provider]  ?furosemide (LASIX) 20 MG tablet Take 1 tablet (20 mg total) by mouth as needed. 12/18/20 03/18/21  Minus Breeding, MD  ?metoprolol succinate (TOPROL-XL) 50 MG 24 hr tablet Take 50 mg by mouth daily. Take with or immediately following a meal.    [provider]  ?nitroGLYCERIN (NITROSTAT) 0.4 MG SL tablet Place 0.4 mg under the tongue every 5 (five) minutes as needed for chest pain.    [provider]  ?pantoprazole (PROTONIX) 40 MG tablet Take 40 mg by mouth daily.    [provider]  ?  ? ?Allergies:    ?Allergies  ?Allergen Reactions  ? Artane [Trihexyphenidyl]   ?  Lip swelling  ? Lopid [Gemfibrozil]   ?  Leg cramps  ? Penicillins   ? Zocor [Simvastatin]   ?  Leg cramps  ? ? ?Social History:   ?Social History  ? ?Socioeconomic History  ? Marital status: Single  ?  Spouse name: Not on file  ?  Number of children: Not on file  ? Years of education: Not on file  ? Highest education level: Not on file  ?Occupational History  ? Not on file  ?Tobacco Use  ? Smoking status: Every Day  ? Smokeless tobacco: Never  ? Tobacco comments:  ?  5 cigarettes per day  ?Vaping Use  ? Vaping Use: Never used  ?Substance and Sexual Activity  ? Alcohol use: No  ? Drug use: No  ? Sexual activity: Not on file  ?Other Topics Concern  ? Not on file  ?Social History Narrative  ? Not on file  ? ?Social Determinants of Health  ? ?Financial Resource Strain: Not on file  ?Food Insecurity: Not on file  ?Transportation Needs: Not on file  ?Physical Activity: Not on file  ?Stress: Not on file  ?Social Connections: Not on file  ?Intimate Partner Violence: Not on file  ?  ?Family History:   ?The patient's family history includes CVA in her sister; Cancer in her father; Colon cancer in  her sister; Esophageal cancer in her brother; Heart disease in her brother, brother, and sister; Prostate cancer in her brother; Transient ischemic attack in her sister.   ? ?ROS:  ?Please see the history of present illness.  ?General:no colds or fevers, no weight changes ?Skin:no rashes or ulcers ?HEENT:no blurred vision, no congestion ?CV:see HPI ?PUL:see HPI ?GI:no diarrhea constipation or melena, no indigestion ?GU:no hematuria, no dysuria ?MS:no joint pain, no claudication ?Neuro:no syncope, no lightheadedness ?Endo:no diabetes, no thyroid disease ?GYN:  Mastectomy Rt  ?All other ROS reviewed and negative.    ? ?Physical Exam/Data:  ? ?Vitals:  ? 12/16/2021 1217 12-16-2021 1235 2021/12/16 1315  ?BP: (!) 146/78 (!) 151/76   ?Pulse: 73 68   ?Resp: 16 (!) 29   ?Temp: 97.6 ?F (36.4 ?C)    ?TempSrc: Oral    ?SpO2: 100% 100% 100%  ?Weight: 53.5 kg    ?Height: '5\' 3"'$  (1.6 m)    ? ?No intake or output data in the 24 hours ending 12-16-2021 1324 ? ?  12-16-21  ? 12:17 PM 07/11/2020  ?  3:34 PM 12/15/2019  ?  8:04 AM  ?Last 3 Weights  ?Weight (lbs) 118 lb 118 lb 6.4 oz 119 lb  ?Weight (kg) 53.524 kg 53.706 kg 53.978 kg  ?   ?Body mass index is 20.9 kg/m?.  ?General:  frail female, in no acute distress ?HEENT: normal ?Neck: no JVD ?Vascular: No carotid bruits; Distal pulses 2+ bilaterally   ?Cardiac:  normal S1, S2; RRR; 1-6/1 systolic murmur no gallup rub or click ?Lungs:  clear to auscultation bilaterally, Ant position, no wheezing, rhonchi or rales  ?Abd: soft, nontender, no hepatomegaly  ?Ext: no edema ?Musculoskeletal:  No deformities, BUE and BLE strength normal and equal ?Skin: warm and dry  ?Neuro:  alert and oriented X 3 MAE follows commands , no focal abnormalities noted ?Psych:  Normal affect  ? ? ?EKG:  The ECG that was done SR with significant ST elevation inf lat leads and significant reciprocal changes in ant. leads was personally reviewed  ? ?Relevant CV Studies: ?Last cath 2004 with non obstructive disease, in  2002 stent to LAD ? ?Laboratory Data: ? ?High Sensitivity Troponin:  No results for input(s): TROPONINIHS in the last 720 hours.    ?ChemistryNo results for input(s): NA, K, CL, CO2, GLUCOSE, BUN, CREATININE, CALCIUM, MG, GFRNONAA, GFRAA, ANIONGAP in the last 168 hours.  ?No results for input(s): PROT, ALBUMIN, AST,  ALT, ALKPHOS, BILITOT in the last 168 hours. ?Lipids  ?Recent Labs  ?Lab 2021-12-16 ?1231  ?CHOL 148  ?TRIG 136  ?HDL 41  ?[place] 80  ?CHOLHDL 3.6  ? ?Hematology ?Recent Labs  ?Lab Dec 16, 2021 ?1231  ?WBC 12.4*  ?RBC 5.03  ?HGB 15.0  ?HCT 46.6*  ?MCV 92.6  ?MCH 29.8  ?MCHC 32.2  ?RDW 14.5  ?PLT 248  ? ?Thyroid No results for input(s): TSH, FREET4 in the last 168 hours. ?BNPNo results for input(s): BNP, PROBNP in the last 168 hours.  ?DDimer No results for input(s): DDIMER in the last 168 hours. ? ? ?Radiology/Studies:  ?DG Chest Port 1 View ? ?Result Date: 12-16-2021 ?CLINICAL DATA:  Pain, abnormal EKG EXAM: PORTABLE CHEST 1 VIEW COMPARISON:  03/28/2016 FINDINGS: Transverse diameter of heart is slightly increased. Thoracic aorta is tortuous. Lung fields are clear of any infiltrates or pulmonary edema. There is no pleural effusion or pneumothorax. Surgical clips are seen in the right axilla. IMPRESSION: No active disease. Electronically Signed   By: Elmer Picker M.D.   On: 2021/12/16 12:48   ? ? ?Assessment and Plan:  ? ?STEMI, inf wall.  To cath lab emergently she did receive ASA and IV heparin.  She is on BB and tricor as out pt - intolerant to zocor will place on lipitor 80, continue BB.  GDMT post cath. Plan echo ?CAD with prior hx of stent to LAD.   ?HLD on tricor, intolerant to zocor will begin lipitor 80 but may need to change to crestor  ?HTN stable ?COPD no wheezes currently monitor. NAD on on PCXR  ?Tobacco use, 2-3 cigarettes per day, discussed stopping.   ?The patient understands that risks included but are not limited to stroke (1 in 1000), death (1 in 27), kidney failure [usually  temporary] (1 in 500), bleeding (1 in 200), allergic reaction [possibly serious] (1 in 200).  ? ? ?Risk Assessment/Risk Scores:  ?  ?TIMI Risk Score for ST  Elevation MI:   ?The patient's TIMI risk score is 4, which in

## 2021-11-24 NOTE — ED Provider Notes (Signed)
?[facility] DEPT ?Provider Note ? ? ?CSN: [id number] ?Arrival date & time: 2021-11-25  1211 ? ?  ? ?History ? ?Chief Complaint  ?Patient presents with  ? Code STEMI  ? ? ?Courtney Larson is a 81 y.o. female. ? ?Patient states since 11 yesterday morning she started having some tightness in her neck that has been off and on all day.  This morning she woke up and she did not have pain but then approximately 10 AM she started with pain in her neck and possible reflux symptoms.  Patient has a history of an MI with a stent ? ?The history is provided by the patient and medical records. No language interpreter was used.  ?Chest Pain ?Pain location:  Unable to specify ?Pain quality: aching   ?Pain radiates to:  Neck ?Pain severity:  Moderate ?Onset quality:  Sudden ?Timing:  Constant ?Progression:  Waxing and waning ?Chronicity:  Recurrent ?Context: not breathing   ?Relieved by:  Nothing ?Worsened by:  Nothing ?Ineffective treatments:  Aspirin ?Associated symptoms: no abdominal pain, no back pain, no cough, no fatigue and no headache   ? ?  ? ?Home Medications ?Prior to Admission medications   ?Medication Sig Start Date End Date Taking? Authorizing Provider  ?aspirin EC 81 MG tablet Take 81 mg by mouth daily.    [provider]  ?fenofibrate (TRICOR) 145 MG tablet Take 145 mg by mouth daily.    [provider]  ?furosemide (LASIX) 20 MG tablet Take 1 tablet (20 mg total) by mouth as needed. 12/18/20 03/18/21  Minus Breeding, MD  ?metoprolol succinate (TOPROL-XL) 50 MG 24 hr tablet Take 50 mg by mouth daily. Take with or immediately following a meal.    [provider]  ?nitroGLYCERIN (NITROSTAT) 0.4 MG SL tablet Place 0.4 mg under the tongue every 5 (five) minutes as needed for chest pain.    [provider]  ?pantoprazole (PROTONIX) 40 MG tablet Take 40 mg by mouth daily.    [provider]  ?   ? ?Allergies    ?Artane [trihexyphenidyl], Lopid  [gemfibrozil], Penicillins, and Zocor [simvastatin]   ? ?Review of Systems   ?Review of Systems  ?Constitutional:  Negative for appetite change and fatigue.  ?HENT:  Negative for congestion, ear discharge and sinus pressure.   ?Eyes:  Negative for discharge.  ?Respiratory:  Negative for cough.   ?Cardiovascular:  Positive for chest pain.  ?Gastrointestinal:  Negative for abdominal pain and diarrhea.  ?Genitourinary:  Negative for frequency and hematuria.  ?Musculoskeletal:  Negative for back pain.  ?Skin:  Negative for rash.  ?Neurological:  Negative for seizures and headaches.  ?Psychiatric/Behavioral:  Negative for hallucinations.   ? ?Physical Exam ?Updated Vital Signs ?BP (!) 151/76   Pulse 68   Temp 97.6 ?F (36.4 ?C) (Oral)   Resp (!) 29   Ht '5\' 3"'$  (1.6 m)   Wt 53.5 kg   SpO2 100%   BMI 20.90 kg/m?  ?Physical Exam ?Vitals and nursing note reviewed. Exam conducted with a chaperone present.  ?Constitutional:   ?   Appearance: She is well-developed.  ?HENT:  ?   Head: Normocephalic.  ?   Nose: Nose normal.  ?Eyes:  ?   General: No scleral icterus. ?   Conjunctiva/sclera: Conjunctivae normal.  ?Neck:  ?   Thyroid: No thyromegaly.  ?Cardiovascular:  ?   Rate and Rhythm: Normal rate and regular rhythm.  ?   Heart sounds: No murmur heard. ?  No friction rub. No gallop.  ?Pulmonary:  ?   Breath sounds: No stridor. No wheezing or rales.  ?Chest:  ?   Chest wall: No tenderness.  ?Abdominal:  ?   General: There is no distension.  ?   Tenderness: There is no abdominal tenderness. There is no rebound.  ?Musculoskeletal:     ?   General: Normal range of motion.  ?   Cervical back: Neck supple.  ?Lymphadenopathy:  ?   Cervical: No cervical adenopathy.  ?Skin: ?   Findings: No erythema or rash.  ?Neurological:  ?   Mental Status: She is alert and oriented to person, place, and time.  ?   Motor: No abnormal muscle tone.  ?   Coordination: Coordination normal.  ?Psychiatric:     ?   Behavior: Behavior normal.  ? ? ?ED  Results / Procedures / Treatments   ?Labs ?(all labs ordered are listed, but only abnormal results are displayed) ?Labs Reviewed  ?RESP PANEL BY RT-PCR (FLU A&B, COVID) ARPGX2  ?HEMOGLOBIN A1C  ?CBC WITH DIFFERENTIAL/PLATELET  ?PROTIME-INR  ?APTT  ?COMPREHENSIVE METABOLIC PANEL  ?LIPID PANEL  ?TROPONIN I (HIGH SENSITIVITY)  ? ? ?EKG ?None ? ?Radiology ?No results found. ? ?Procedures ?Procedures  ? ? ?Medications Ordered in ED ?Medications  ?0.9 %  sodium chloride infusion (has no administration in time range)  ?nitroGLYCERIN (NITROSTAT) SL tablet 0.4 mg (has no administration in time range)  ?heparin injection 3,200 Units (3,200 Units Intravenous Given November 20, 2021 1233)  ? ? ?ED Course/ Medical Decision Making/ A&P ? CRITICAL CARE ?Performed by: Milton Ferguson ?Total critical care time: 35 minutes ?Critical care time was exclusive of separately billable procedures and treating other patients. ?Critical care was necessary to treat or prevent imminent or life-threatening deterioration. ?Critical care was time spent personally by me on the following activities: development of treatment plan with patient and/or surrogate as well as nursing, discussions with consultants, evaluation of patient's response to treatment, examination of patient, obtaining history from patient or surrogate, ordering and performing treatments and interventions, ordering and review of laboratory studies, ordering and review of radiographic studies, pulse oximetry and re-evaluation of patient's condition. ? ?                        ?Medical Decision Making ?Amount and/or Complexity of Data Reviewed ?Labs: ordered. ?Radiology: ordered. ? ?Risk ?Prescription drug management. ? ? ?This patient presents to the ED for concern of neck pain, this involves an extensive number of treatment options, and is a complaint that carries with it a high risk of complications and morbidity.  The differential diagnosis includes GERD, MI ? ? ?Co morbidities that  complicate the patient evaluation ? ?Coronary artery disease ? ? ?Additional history obtained: ? ?Additional history obtained from paramedic ?External records from outside source obtained and reviewed including hospital record ? ? ?Lab Tests: ? ?I Ordered, and personally interpreted labs.  The pertinent results include: Labs pending ? ? ?Imaging Studies ordered: ? ?I ordered imaging studies including chest x-ray ?I independently visualized and interpreted imaging which showed unremarkable ?I agree with the radiologist interpretation ? ? ?Cardiac Monitoring: / EKG: ? ?The patient was maintained on a cardiac monitor.  I personally viewed and interpreted the cardiac monitored which showed an underlying rhythm of: Inferior STEMI ? ? ?Consultations Obtained: ? ?I requested consultation with the cardiology,  and discussed lab and imaging findings as well as pertinent plan - they recommend: Transfer immediately  to Cath Lab ? ? ?Problem List / ED Course / Critical interventions / Medication management ? ?Chest pain with STEMI ?I ordered medication including heparin for STEMI ?Reevaluation of the patient after these medicines showed that the patient stayed the same ?I have reviewed the patients home medicines and have made adjustments as needed ? ? ?Social Determinants of Health: ? ?None ? ? ?Test / Admission - Considered: ? ?Cardiac cath ? ? ? ?EKG shows patient is having an inferior STEMI.  I contacted Dr.Thukkani and he reviewed the EKG and felt like the patient needed to be transferred over to [facility] cardiac Cath Lab immediately.  She was started on heparin ? ? ? ? ? ? ? ?Final Clinical Impression(s) / ED Diagnoses ?Final diagnoses:  ?None  ? ? ?Rx / DC Orders ?ED Discharge Orders   ? ? None  ? ?  ? ? ?  ?Milton Ferguson, MD ?12-08-21 1247 ? ?

## 2021-11-24 NOTE — Progress Notes (Signed)
?  Echocardiogram ?2D Echocardiogram limited has been performed. ? Courtney Larson ?2021-11-27, 3:35 PM ?

## 2021-11-24 NOTE — ED Triage Notes (Addendum)
Patient BIBA from home, c/o acid reflux symptoms which started last night. She reports feeling like she needs to burp. Hx HTN, cervical dystonia, PUD. Pt given 4 baby ASA PTA ? ? ?BP 130/86 ?P 82  ?SpO2 100% RA ? ?

## 2021-11-24 NOTE — Discharge Instructions (Signed)
Patient needs to be transferred to [facility] cardiac Cath Lab to treat a STEMI ?

## 2021-11-24 DEATH — deceased

## 2021-12-14 ENCOUNTER — Ambulatory Visit: Payer: Medicare Other | Admitting: Neurology

## 2024-03-02 IMAGING — DX DG CHEST 1V PORT
1 series · 1 of 1 positions shown · non-contrast
Comparison: 03/28/2016

CLINICAL DATA: Pain, abnormal EKG

EXAM:
PORTABLE CHEST 1 VIEW

[chest ap]
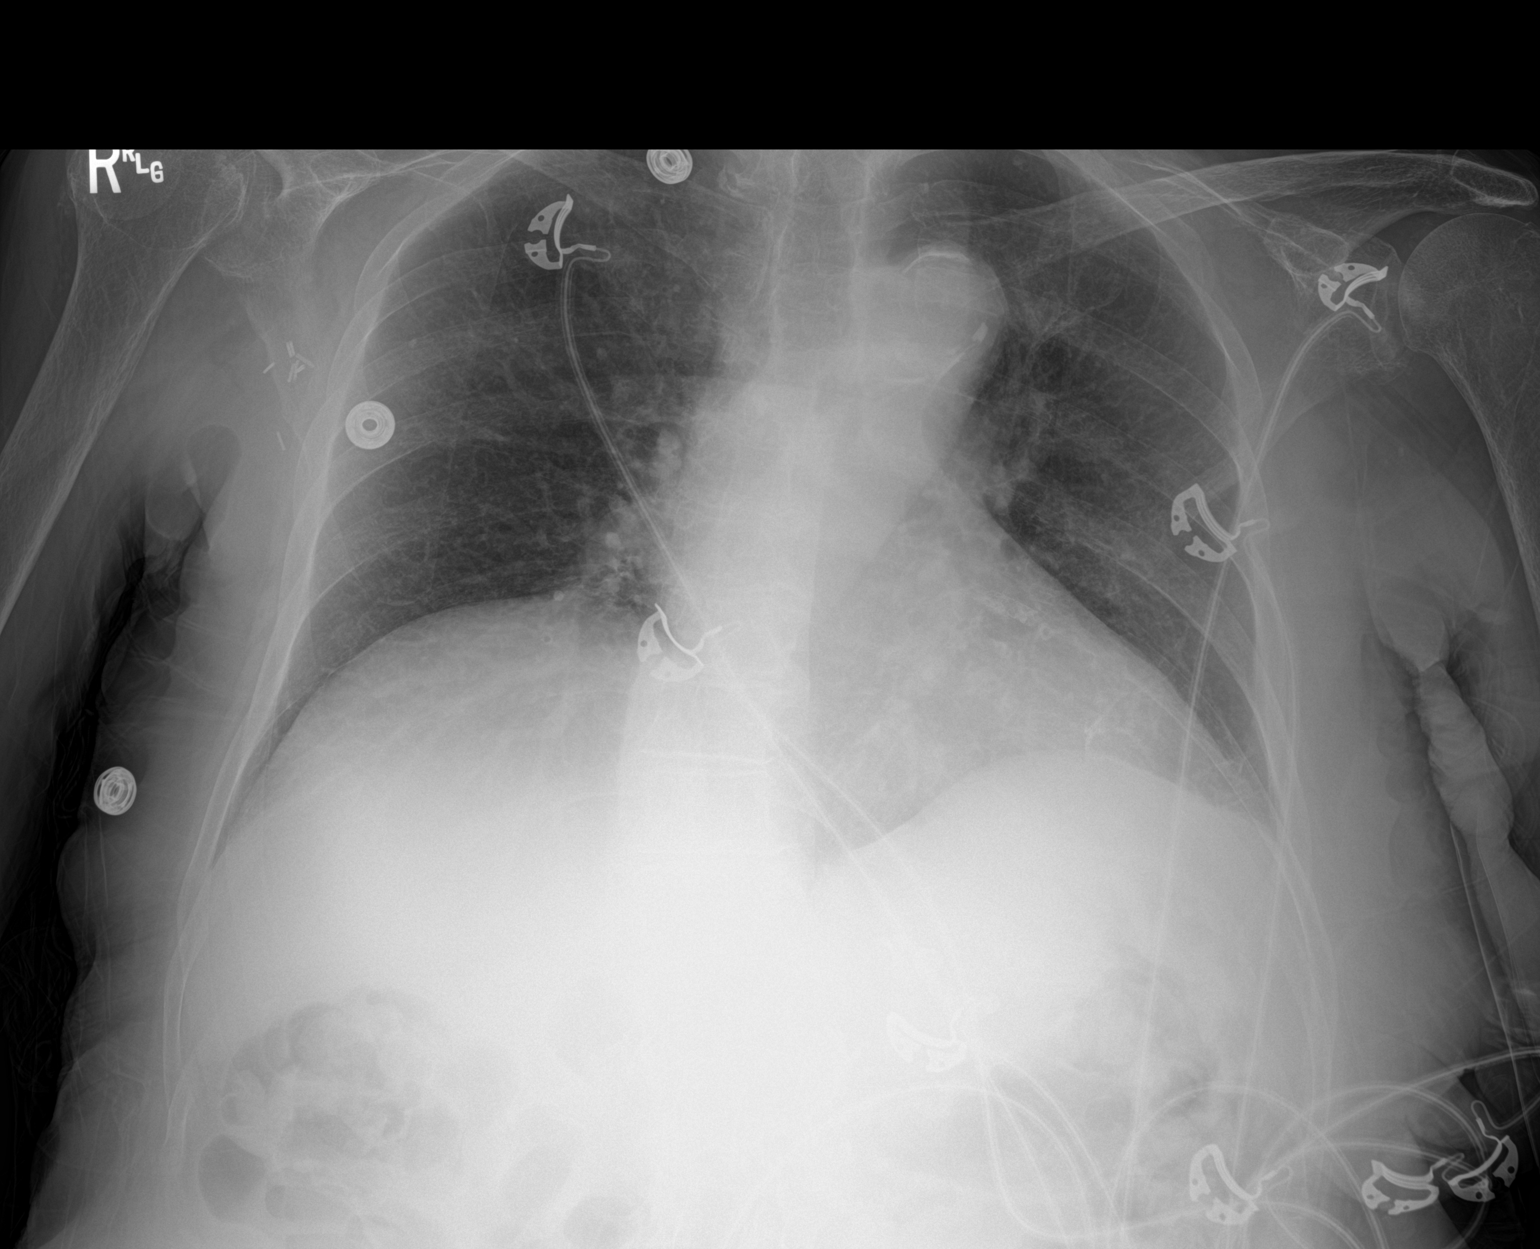

[1 of 1 positions shown; findings below may reference images not displayed]

FINDINGS: Transverse diameter of heart is slightly increased. Thoracic aorta
is tortuous. Lung fields are clear of any infiltrates or pulmonary
edema. There is no pleural effusion or pneumothorax. Surgical clips
are seen in the right axilla.
IMPRESSION: No active disease.
